# Patient Record
Sex: Male | Born: 1950 | Race: White | Hispanic: No | Marital: Married | Smoking: Former smoker
Health system: Southern US, Community
[De-identification: ages and names within clinical notes are randomized; demographics above are authoritative.]

## PROBLEM LIST (undated history)

## (undated) DIAGNOSIS — Z972 Presence of dental prosthetic device (complete) (partial): Secondary | ICD-10-CM

## (undated) DIAGNOSIS — M1990 Unspecified osteoarthritis, unspecified site: Secondary | ICD-10-CM

## (undated) DIAGNOSIS — Z974 Presence of external hearing-aid: Secondary | ICD-10-CM

## (undated) DIAGNOSIS — I10 Essential (primary) hypertension: Secondary | ICD-10-CM

## (undated) DIAGNOSIS — Z973 Presence of spectacles and contact lenses: Secondary | ICD-10-CM

## (undated) HISTORY — PX: ROTATOR CUFF REPAIR: SHX139

## (undated) HISTORY — PX: APPENDECTOMY: SHX54

---

## 2003-06-01 ENCOUNTER — Ambulatory Visit (HOSPITAL_COMMUNITY): Admission: RE | Admit: 2003-06-01 | Discharge: 2003-06-01 | Payer: Self-pay | Admitting: Internal Medicine

## 2003-06-01 ENCOUNTER — Encounter: Payer: Self-pay | Admitting: Internal Medicine

## 2006-09-27 ENCOUNTER — Ambulatory Visit (HOSPITAL_COMMUNITY): Admission: RE | Admit: 2006-09-27 | Discharge: 2006-09-27 | Payer: Self-pay | Admitting: General Surgery

## 2015-01-15 ENCOUNTER — Emergency Department (INDEPENDENT_AMBULATORY_CARE_PROVIDER_SITE_OTHER)
Admission: EM | Admit: 2015-01-15 | Discharge: 2015-01-15 | Disposition: A | Discharge status: Home / Self Care | Payer: BLUE CROSS/BLUE SHIELD | Source: Home / Self Care | Attending: Family Medicine | Admitting: Family Medicine

## 2015-01-15 ENCOUNTER — Encounter (HOSPITAL_COMMUNITY): Payer: Self-pay

## 2015-01-15 DIAGNOSIS — J301 Allergic rhinitis due to pollen: Secondary | ICD-10-CM | POA: Diagnosis not present

## 2015-01-15 DIAGNOSIS — I10 Essential (primary) hypertension: Secondary | ICD-10-CM | POA: Diagnosis not present

## 2015-01-15 DIAGNOSIS — G250 Essential tremor: Secondary | ICD-10-CM | POA: Diagnosis not present

## 2015-01-15 HISTORY — DX: Essential (primary) hypertension: I10

## 2015-01-15 NOTE — Discharge Instructions (Signed)
Allergic Rhinitis For drainage and allergies take either Allegra, Zyrtec or Claritin. Use lots of saline nasal spray and often Use Flonase or Rhinocort daily For congestion may use Sudafed 10 mg every 4 hours but your blood pressure has to be under good control. Do not advise taking this medicine if her blood pressure is elevated. Drink lots of fluids and stay well-hydrated Follow-up here PCP. Allergic rhinitis is when the mucous membranes in the nose respond to allergens. Allergens are particles in the air that cause your body to have an allergic reaction. This causes you to release allergic antibodies. Through a chain of events, these eventually cause you to release histamine into the blood stream. Although meant to protect the body, it is this release of histamine that causes your discomfort, such as frequent sneezing, congestion, and an itchy, runny nose.  CAUSES  Seasonal allergic rhinitis (hay fever) is caused by pollen allergens that may come from grasses, trees, and weeds. Year-round allergic rhinitis (perennial allergic rhinitis) is caused by allergens such as house dust mites, pet dander, and mold spores.  SYMPTOMS   Nasal stuffiness (congestion).  Itchy, runny nose with sneezing and tearing of the eyes. DIAGNOSIS  Your health care provider can help you determine the allergen or allergens that trigger your symptoms. If you and your health care provider are unable to determine the allergen, skin or blood testing may be used. TREATMENT  Allergic rhinitis does not have a cure, but it can be controlled by:  Medicines and allergy shots (immunotherapy).  Avoiding the allergen. Hay fever may often be treated with antihistamines in pill or nasal spray forms. Antihistamines block the effects of histamine. There are over-the-counter medicines that may help with nasal congestion and swelling around the eyes. Check with your health care provider before taking or giving this medicine.  If  avoiding the allergen or the medicine prescribed do not work, there are many new medicines your health care provider can prescribe. Stronger medicine may be used if initial measures are ineffective. Desensitizing injections can be used if medicine and avoidance does not work. Desensitization is when a patient is given ongoing shots until the body becomes less sensitive to the allergen. Make sure you follow up with your health care provider if problems continue. HOM Hypertension Hypertension is another name for high blood pressure. High blood pressure forces your heart to work harder to pump blood. A blood pressure reading has two numbers, which includes a higher number over a lower number (example: 110/72). HOME CARE   Have your blood pressure rechecked by your doctor.  Only take medicine as told by your doctor. Follow the directions carefully. The medicine does not work as well if you skip doses. Skipping doses also puts you at risk for problems.  Do not smoke.  Monitor your blood pressure at home as told by your doctor. GET HELP IF:  You think you are having a reaction to the medicine you are taking.  You have repeat headaches or feel dizzy.  You have puffiness (swelling) in your ankles.  You have trouble with your vision. GET HELP RIGHT AWAY IF:   You get a very bad headache and are confused.  You feel weak, numb, or faint.  You get chest or belly (abdominal) pain.  You throw up (vomit).  You cannot breathe very well. MAKE SURE YOU:   Understand these instructions.  Will watch your condition.  Will get help right away if you are not doing well or get  worse. Document Released: 03/05/2008 Document Revised: 09/22/2013 Document Reviewed: 07/10/2013 [facility] Patient Information 2015 [place], [place]. This information is not intended to replace advice given to you by your health care provider. Make sure you discuss any questions you have with your health care  provider.  Managing Your High Blood Pressure Blood pressure is a measurement of how forceful your blood is pressing against the walls of the arteries. Arteries are muscular tubes within the circulatory system. Blood pressure does not stay the same. Blood pressure rises when you are active, excited, or nervous; and it lowers during sleep and relaxation. If the numbers measuring your blood pressure stay above normal most of the time, you are at risk for health problems. High blood pressure (hypertension) is a long-term (chronic) condition in which blood pressure is elevated. A blood pressure reading is recorded as two numbers, such as 120 over 80 (or 120/80). The first, higher number is called the systolic pressure. It is a measure of the pressure in your arteries as the heart beats. The second, lower number is called the diastolic pressure. It is a measure of the pressure in your arteries as the heart relaxes between beats.  Keeping your blood pressure in a normal range is important to your overall health and prevention of health problems, such as heart disease and stroke. When your blood pressure is uncontrolled, your heart has to work harder than normal. High blood pressure is a very common condition in adults because blood pressure tends to rise with age. Men and women are equally likely to have hypertension but at different times in life. Before age 82, men are more likely to have hypertension. After 64 years of age, women are more likely to have it. Hypertension is especially common in African Americans. This condition often has no signs or symptoms. The cause of the condition is usually not known. Your caregiver can help you come up with a plan to keep your blood pressure in a normal, healthy range. BLOOD PRESSURE STAGES Blood pressure is classified into four stages: normal, prehypertension, stage 1, and stage 2. Your blood pressure reading will be used to determine what type of treatment, if any, is  necessary. Appropriate treatment options are tied to these four stages:  Normal  Systolic pressure (mm Hg): below 120.  Diastolic pressure (mm Hg): below 80. Prehypertension  Systolic pressure (mm Hg): 120 to 139.  Diastolic pressure (mm Hg): 80 to 89. Stage1  Systolic pressure (mm Hg): 140 to 159.  Diastolic pressure (mm Hg): 90 to 99. Stage2  Systolic pressure (mm Hg): 160 or above.  Diastolic pressure (mm Hg): 100 or above. RISKS RELATED TO HIGH BLOOD PRESSURE Managing your blood pressure is an important responsibility. Uncontrolled high blood pressure can lead to:  A heart attack.  A stroke.  A weakened blood vessel (aneurysm).  Heart failure.  Kidney damage.  Eye damage.  Metabolic syndrome.  Memory and concentration problems. HOW TO MANAGE YOUR BLOOD PRESSURE Blood pressure can be managed effectively with lifestyle changes and medicines (if needed). Your caregiver will help you come up with a plan to bring your blood pressure within a normal range. Your plan should include the following: Education  Read all information provided by your caregivers about how to control blood pressure.  Educate yourself on the latest guidelines and treatment recommendations. New research is always being done to further define the risks and treatments for high blood pressure. Lifestylechanges  Control your weight.  Avoid smoking.  Stay physically  active.  Reduce the amount of salt in your diet.  Reduce stress.  Control any chronic conditions, such as high cholesterol or diabetes.  Reduce your alcohol intake. Medicines  Several medicines (antihypertensive medicines) are available, if needed, to bring blood pressure within a normal range. Communication  Review all the medicines you take with your caregiver because there may be side effects or interactions.  Talk with your caregiver about your diet, exercise habits, and other lifestyle factors that may be  contributing to high blood pressure.  See your caregiver regularly. Your caregiver can help you create and adjust your plan for managing high blood pressure. RECOMMENDATIONS FOR TREATMENT AND FOLLOW-UP  The following recommendations are based on current guidelines for managing high blood pressure in nonpregnant adults. Use these recommendations to identify the proper follow-up period or treatment option based on your blood pressure reading. You can discuss these options with your caregiver.  Systolic pressure of 120 to 139 or diastolic pressure of 80 to 89: Follow up with your caregiver as directed.  Systolic pressure of 140 to 160 or diastolic pressure of 90 to 100: Follow up with your caregiver within 2 months.  Systolic pressure above 160 or diastolic pressure above 100: Follow up with your caregiver within 1 month.  Systolic pressure above 180 or diastolic pressure above 110: Consider antihypertensive therapy; follow up with your caregiver within 1 week.  Systolic pressure above 200 or diastolic pressure above 120: Begin antihypertensive therapy; follow up with your caregiver within 1 week. Document Released: 06/11/2012 Document Reviewed: 06/11/2012 [facility] Patient Information 2015 [place], [place]. This information is not intended to replace advice given to you by your health care provider. Make sure you discuss any questions you have with your health care provider. E CARE INSTRUCTIONS It is not possible to completely avoid allergens, but you can reduce your symptoms by taking steps to limit your exposure to them. It helps to know exactly what you are allergic to so that you can avoid your specific triggers. SEEK MEDICAL CARE IF:   You have a fever.  You develop a cough that does not stop easily (persistent).  You have shortness of breath.  You start wheezing.  Symptoms interfere with normal daily activities. Document Released: 06/12/2001 Document Revised: 09/22/2013 Document  Reviewed: 05/25/2013 [facility] Patient Information 2015 [place], [place]. This information is not intended to replace advice given to you by your health care provider. Make sure you discuss any questions you have with your health care provider.

## 2015-01-15 NOTE — ED Notes (Signed)
C/o upper resp congestion, hoarseness. Admits he has not taken his BP medication x couple of days, since they make him feel badly. Advised he should talk to his MD about changing his Rx if it makes him feel bad.

## 2015-01-15 NOTE — ED Provider Notes (Signed)
CSN: [id number]     Arrival date & time 01/15/15  2130 History   First MD Initiated Contact with Patient 01/15/15 1006     Chief Complaint  Patient presents with  . URI   (Consider location/radiation/quality/duration/timing/severity/associated sxs/prior Treatment) HPI Comments: 64 year old male complaining of a 2-3 day history of nasal congestion, PND, minor sore throat and cough. Denies earache or fever.   Past Medical History  Diagnosis Date  . Hypertension    History reviewed. No pertinent past surgical history. History reviewed. No pertinent family history. History  Substance Use Topics  . Smoking status: Never Smoker   . Smokeless tobacco: Not on file  . Alcohol Use: Yes    Review of Systems  Constitutional: Positive for activity change. Negative for fever, diaphoresis, appetite change and fatigue.  HENT: Positive for congestion, postnasal drip and sore throat. Negative for ear discharge, ear pain, facial swelling and rhinorrhea.   Eyes: Negative for pain, discharge and redness.  Respiratory: Positive for cough. Negative for chest tightness, shortness of breath and wheezing.   Cardiovascular: Negative.   Gastrointestinal: Negative.   Musculoskeletal: Negative.  Negative for neck pain and neck stiffness.  Neurological: Negative.     Allergies  Review of patient's allergies indicates no known allergies.  Home Medications   Prior to Admission medications   Medication Sig Start Date End Date Taking? Authorizing Provider  amLODipine (NORVASC) 10 MG tablet Take 10 mg by mouth daily.   Yes Historical Provider, MD  aspirin 81 MG tablet Take 81 mg by mouth daily.   Yes Historical Provider, MD  fosinopril (MONOPRIL) 40 MG tablet Take 40 mg by mouth daily.   Yes Historical Provider, MD  propranolol ER (INDERAL LA) 80 MG 24 hr capsule Take 80 mg by mouth daily.   Yes Historical Provider, MD   BP 185/100 mmHg  Pulse 70  Temp(Src) 98.6 F (37 C) (Oral)  Resp 20  SpO2  95% Physical Exam  Constitutional: He is oriented to person, place, and time. He appears well-developed and well-nourished. No distress.  HENT:  Bilateral TMs are normal Oropharynx with minor erythema, cobblestoning and PND.  Eyes: EOM are normal. Pupils are equal, round, and reactive to light.  Mild contact arrival erythema with clear watery drainage to the right eye.  Neck: Normal range of motion. Neck supple.  Cardiovascular: Normal rate, regular rhythm and normal heart sounds.   Pulmonary/Chest: Effort normal and breath sounds normal. No respiratory distress. He has no wheezes.  Musculoskeletal: Normal range of motion. He exhibits no edema.  Lymphadenopathy:    He has no cervical adenopathy.  Neurological: He is alert and oriented to person, place, and time.  Skin: Skin is warm and dry. No rash noted.  Psychiatric: He has a normal mood and affect.  Nursing note and vitals reviewed.   ED Course  Procedures (including critical care time) Labs Review Labs Reviewed - No data to display  Imaging Review No results found.   MDM   1. Allergic rhinitis due to pollen   2. Essential hypertension   3. Benign familial tremor    Allergic Rhinitis For drainage and allergies take either Allegra, Zyrtec or Claritin. Use lots of saline nasal spray and often Zaditor eye drops Use Flonase or Rhinocort daily For congestion may use Sudafed 10 mg every 4 hours but your blood pressure has to be under good control. Do not advise taking this medicine if her blood pressure is elevated. Drink lots of fluids and stay  well-hydrated Follow-up here PCP.    Hayden Rasmussenavid Keshan Reha, NP 01/15/15 [phone]

## 2016-07-31 NOTE — H&P (Signed)
NTS SOAP Note  Vital Signs:  Vitals as of: 07/31/2016: Systolic 135: Diastolic 81: Heart Rate 60: Temp 97.4F (Temporal): Height 686ft 0in: Weight 237Lbs 0 Ounces: BMI 32.14   BMI : 32.14 kg/m2  Subjective: This 65 year old male presents for of screening colonoscopy.  Referred by Dr. Neita CarpSasser.  Last had a colonoscopy in 2007.  Denies any blood per rectum, abdominal pain, weight loss, diarrhea, constipation, family h/o colon carcinoma.  Review of Symptoms:  tremors Head:negative Eyes:negative Nose/Mouth/Throat:negative Cardiovascular:negative Respiratory:negative Gastrointestinnegative Genitourinary:negative Musculoskeletal:negative Skin:negative Hematolgic/Lymphatic:negative Allergic/Immunologic:negative   Past Medical History:Reviewed  Past Medical History  Surgical History: bilateral shoulder surgery Medical Problems: HTN Allergies: nkda Medications: propranolol, fosinopril, amlodipine   Social History:Reviewed  Social History  Preferred Language: English Race:  White Ethnicity: Not Hispanic / Latino Age: [age] year Marital Status:  M Alcohol: 6 pk/week   Smoking Status: Never smoker reviewed on 07/31/2016 Functional Status reviewed on 07/31/2016 ------------------------------------------------ Bathing: Normal Cooking: Normal Dressing: Normal Driving: Normal Eating: Normal Managing Meds: Normal Oral Care: Normal Shopping: Normal Toileting: Normal Transferring: Normal Walking: Normal Cognitive Status reviewed on 07/31/2016 ------------------------------------------------ Attention: Normal Decision Making: Normal Language: Normal Memory: Normal Motor: Normal Perception: Normal Problem Solving: Normal Visual and Spatial: Normal   Family History:Reviewed  Family Health History Mother, Deceased; Healthy;  Father, Deceased; Healthy;     Objective Information: General:Well appearing, well nourished in no distress. Skin:no  rash or prominent lesions Head:Atraumatic; no masses; no abnormalities Neck:Supple without lymphadenopathy.  Heart:RRR, no murmur or gallop.  Normal S1, S2.  No S3, S4.  Lungs:CTA bilaterally, no wheezes, rhonchi, rales.  Breathing unlabored. Abdomen:Soft, NT/ND, normal bowel sounds, no HSM, no masses.  No peritoneal signs. deferred to procedure [facility] notes reviewed. Assessment:Need for screening TCS  Diagnoses: V76.51  Z12.11 Screening for malignant neoplasm of colon (Encounter for screening for malignant neoplasm of colon)  Procedures: [id number] - OFFICE OUTPATIENT NEW 30 MINUTES    Plan:  Scheduled for screening TCS on 08/07/16.  Trilyte prescribed.   Patient Education:Alternative treatments to surgery were discussed with patient (and family).Risks and benefits  of procedure including bleeding and perforation were fully explained to the patient (and family) who gave informed consent. Patient/family questions were addressed.  Follow-up:Pending Surgery

## 2016-08-07 ENCOUNTER — Ambulatory Visit (HOSPITAL_COMMUNITY)
Admission: RE | Admit: 2016-08-07 | Discharge: 2016-08-07 | Disposition: A | Discharge status: Home / Self Care | Payer: BLUE CROSS/BLUE SHIELD | Source: Ambulatory Visit | Attending: General Surgery | Admitting: General Surgery

## 2016-08-07 ENCOUNTER — Encounter (HOSPITAL_COMMUNITY): Payer: Self-pay | Admitting: *Deleted

## 2016-08-07 ENCOUNTER — Encounter (HOSPITAL_COMMUNITY)
Admission: RE | Disposition: A | Discharge status: Home / Self Care | Payer: Self-pay | Source: Ambulatory Visit | Attending: General Surgery

## 2016-08-07 DIAGNOSIS — I10 Essential (primary) hypertension: Secondary | ICD-10-CM | POA: Diagnosis not present

## 2016-08-07 DIAGNOSIS — Z79899 Other long term (current) drug therapy: Secondary | ICD-10-CM | POA: Insufficient documentation

## 2016-08-07 DIAGNOSIS — Z1211 Encounter for screening for malignant neoplasm of colon: Secondary | ICD-10-CM | POA: Insufficient documentation

## 2016-08-07 DIAGNOSIS — K5730 Diverticulosis of large intestine without perforation or abscess without bleeding: Secondary | ICD-10-CM | POA: Insufficient documentation

## 2016-08-07 HISTORY — PX: COLONOSCOPY: SHX5424

## 2016-08-07 SURGERY — COLONOSCOPY
Anesthesia: Moderate Sedation

## 2016-08-07 MED ORDER — STERILE WATER FOR IRRIGATION IR SOLN
Status: DC | PRN
  Administered 2016-08-07: 07:00:00

## 2016-08-07 MED ORDER — MEPERIDINE HCL 50 MG/ML IJ SOLN
INTRAMUSCULAR | Status: AC
  Filled 2016-08-07: qty 1

## 2016-08-07 MED ORDER — MIDAZOLAM HCL 5 MG/5ML IJ SOLN
INTRAMUSCULAR | Status: AC
  Filled 2016-08-07: qty 5

## 2016-08-07 MED ORDER — MIDAZOLAM HCL 5 MG/5ML IJ SOLN
INTRAMUSCULAR | Status: DC | PRN
  Administered 2016-08-07: 1 mg via INTRAVENOUS
  Administered 2016-08-07: 3 mg via INTRAVENOUS
  Administered 2016-08-07: 1 mg via INTRAVENOUS

## 2016-08-07 MED ORDER — SODIUM CHLORIDE 0.9 % IV SOLN
INTRAVENOUS | Status: DC
  Administered 2016-08-07: 07:00:00 via INTRAVENOUS

## 2016-08-07 MED ORDER — MEPERIDINE HCL 50 MG/ML IJ SOLN
INTRAMUSCULAR | Status: DC | PRN
  Administered 2016-08-07: 50 mg via INTRAVENOUS
  Administered 2016-08-07 (×2): 25 mg via INTRAVENOUS

## 2016-08-07 NOTE — Interval H&P Note (Signed)
History and Physical Interval Note:  08/07/2016 7:24 AM  Ricky Bonilla  has presented today for surgery, with the diagnosis of screening  The various methods of treatment have been discussed with the patient and family. After consideration of risks, benefits and other options for treatment, the patient has consented to  Procedure(s): COLONOSCOPY (N/A) as a surgical intervention .  The patient's history has been reviewed, patient examined, no change in status, stable for surgery.  I have reviewed the patient's chart and labs.  Questions were answered to the patient's satisfaction.     Franky MachoJENKINS,Yoko Mcgahee A

## 2016-08-07 NOTE — Discharge Instructions (Signed)
Colonoscopy, Care After Refer to this sheet in the next few weeks. These instructions provide you with information on caring for yourself after your procedure. Your health care provider may also give you more specific instructions. Your treatment has been planned according to current medical practices, but problems sometimes occur. Call your health care provider if you have any problems or questions after your procedure.  Dr Lovell SheehanJenkins.  [phone]  WHAT TO EXPECT AFTER THE PROCEDURE  After your procedure, it is typical to have the following:  A small amount of blood in your stool.  Moderate amounts of gas and mild abdominal cramping or bloating. HOME CARE INSTRUCTIONS  Do not drive, operate machinery, or sign important documents for 24 hours.  You may shower and resume your regular physical activities, but move at a slower pace for the first 24 hours.  Take frequent rest periods for the first 24 hours.  Walk around or put a warm pack on your abdomen to help reduce abdominal cramping and bloating.  Drink enough fluids to keep your urine clear or pale yellow.  You may resume your normal diet as instructed by your health care provider. Avoid heavy or fried foods that are hard to digest.  Avoid drinking alcohol for 24 hours or as instructed by your health care provider.  Only take over-the-counter or prescription medicines as directed by your health care provider.  If a tissue sample (biopsy) was taken during your procedure:  Do not take aspirin or blood thinners for 7 days, or as instructed by your health care provider.  Do not drink alcohol for 7 days, or as instructed by your health care provider.  Eat soft foods for the first 24 hours. SEEK MEDICAL CARE IF: You have persistent spotting of blood in your stool 2-3 days after the procedure. SEEK IMMEDIATE MEDICAL CARE IF:  You have more than a small spotting of blood in your stool.  You pass large blood clots in your  stool.  Your abdomen is swollen (distended).  You have nausea or vomiting.  You have a fever.  You have increasing abdominal pain that is not relieved with medicine.   This information is not intended to replace advice given to you by your health care provider. Make sure you discuss any questions you have with your health care provider.   Document Released: 05/01/2004 Document Revised: 07/08/2013 Document Reviewed: 05/25/2013 [employer] Interactive Patient Education [employer]2016 [employer]

## 2016-08-07 NOTE — Op Note (Signed)
[facility] Patient Name: Ricky Bonilla Procedure Date: 08/07/2016 7:21 AM MRN: [id number] Date of Birth: [birth year] Attending MD: Franky Macho , MD CSN: [id number] Age: 65 Admit Type: Outpatient Procedure:                Colonoscopy Indications:              Screening for colorectal malignant neoplasm Providers:                Franky Macho, MD, Loma Messing B. Patsy Lager, RN, Birder Robson, Technician Referring MD:              Medicines:                Midazolam 5 mg IV, Meperidine 50 mg IV Complications:            No immediate complications. Estimated Blood Loss:     Estimated blood loss: none. Procedure:                Pre-Anesthesia Assessment:                           - Prior to the procedure, a History and Physical                            was performed, and patient medications and                            allergies were reviewed. The patient is competent.                            The risks and benefits of the procedure and the                            sedation options and risks were discussed with the                            patient. All questions were answered and informed                            consent was obtained. Patient identification and                            proposed procedure were verified by the nurse in                            the procedure room. Mental Status Examination:                            normal. Airway Examination: normal oropharyngeal                            airway and neck mobility. Respiratory Examination:  clear to auscultation. CV Examination: RRR, no                            murmurs, no S3 or S4. Prophylactic Antibiotics: The                            patient does not require prophylactic antibiotics.                            Prior Anticoagulants: The patient has taken no                            previous anticoagulant or antiplatelet agents. ASA           Grade Assessment: II - A patient with mild systemic                            disease. After reviewing the risks and benefits,                            the patient was deemed in satisfactory condition to                            undergo the procedure. The anesthesia plan was to                            use moderate sedation / analgesia (conscious                            sedation). Immediately prior to administration of                            medications, the patient was re-assessed for                            adequacy to receive sedatives. The heart rate,                            respiratory rate, oxygen saturations, blood                            pressure, adequacy of pulmonary ventilation, and                            response to care were monitored throughout the                            procedure. The physical status of the patient was                            re-assessed after the procedure.                           After obtaining informed consent, the colonoscope  was passed under direct vision. Throughout the                            procedure, the patient's blood pressure, pulse, and                            oxygen saturations were monitored continuously. The                            [id number]) scope was introduced through                            the anus and advanced to the the cecum, identified                            by the appendiceal orifice, ileocecal valve and                            palpation. No anatomical landmarks were                            photographed. The colonoscopy was performed without                            difficulty. The patient tolerated the procedure                            well. The quality of the bowel preparation was                            adequate. Scope In: 7:30:39 AM Scope Out: 7:43:55 AM Scope Withdrawal Time: 0 hours 5 minutes 23 seconds  Total  Procedure Duration: 0 hours 13 minutes 16 seconds  Findings:      A few small-mouthed diverticula were found in the sigmoid colon.       Estimated blood loss: none.      The exam was otherwise without abnormality.      The perianal and digital rectal examinations were normal.      Retroflexion [Site] was not performed due to anatomy. Impression:               - Mild diverticulosis in the sigmoid colon.                           - The examination was otherwise normal.                           - No specimens collected. Moderate Sedation:      Moderate (conscious) sedation was administered by the endoscopy nurse       and supervised by the endoscopist. The following parameters were       monitored: oxygen saturation, heart rate, blood pressure, and response       to care. Total physician intraservice time was 14 minutes. Recommendation:           - Patient has a contact number available for  emergencies. The signs and symptoms of potential                            delayed complications were discussed with the                            patient. Return to normal activities tomorrow.                            Written discharge instructions were provided to the                            patient.                           - Written discharge instructions were provided to                            the patient.                           - The signs and symptoms of potential delayed                            complications were discussed with the patient.                           - Patient has a contact number available for                            emergencies.                           - Return to normal activities tomorrow.                           - Resume previous diet.                           - Continue present medications.                           - Repeat colonoscopy in 10 years for screening                            purposes. Procedure Code(s):         --- Professional ---                           [id number], Colorectal cancer screening; colonoscopy on                            individual not meeting criteria for high risk                           99152, Moderate sedation services provided by the  same physician or other qualified health care                            professional performing the diagnostic or                            therapeutic service that the sedation supports,                            requiring the presence of an independent trained                            observer to assist in the monitoring of the                            patient's level of consciousness and physiological                            status; initial 15 minutes of intraservice time,                            patient age 10 years or older Diagnosis Code(s):        --- Professional ---                           Z12.11, Encounter for screening for malignant                            neoplasm of colon                           K57.30, Diverticulosis of large intestine without                            perforation or abscess without bleeding CPT copyright 2016 American Medical Association. All rights reserved. The codes documented in this report are preliminary and upon coder review may  be revised to meet current compliance requirements. Franky Macho, MD Franky Macho, MD 08/07/2016 7:50:37 AM This report has been signed electronically. Number of Addenda: 0

## 2016-08-13 ENCOUNTER — Encounter (HOSPITAL_COMMUNITY): Payer: Self-pay | Admitting: General Surgery

## 2016-08-14 NOTE — Progress Notes (Signed)
Scheduling pre op--please place SURGICAL ORDERS IN EPIC  thanks 

## 2016-08-15 ENCOUNTER — Ambulatory Visit: Payer: Self-pay | Admitting: Orthopedic Surgery

## 2016-08-22 ENCOUNTER — Inpatient Hospital Stay (HOSPITAL_COMMUNITY): Admission: RE | Admit: 2016-08-22 | Payer: BLUE CROSS/BLUE SHIELD | Source: Ambulatory Visit

## 2016-09-17 ENCOUNTER — Encounter (HOSPITAL_COMMUNITY): Payer: BLUE CROSS/BLUE SHIELD

## 2016-09-17 NOTE — Progress Notes (Signed)
07/21/2016-Pre-operative clearance on chart from Dr. Floyde ParkinsErin Inigo Carroll

## 2016-09-17 NOTE — Patient Instructions (Addendum)
Lonell FaceJames R Birnie  09/17/2016   Your procedure is scheduled on: Thursday 09/27/2016  [facility]  [place] to 3rd floor to  Short Stay Center at   215 PM.  Call this number if you have problems the morning of surgery [phone]   Remember: ONLY 1 PERSON MAY GO WITH YOU TO SHORT STAY TO GET  READY MORNING OF YOUR SURGERY.   Do not eat food  :After Midnight.  MAY HAVE CLEAR LIQUIDS FROM MIDNIGHT UP UNTIL 1015 AM THEN NOTHING UNTIL AFTER SURGERY!     CLEAR LIQUID DIET   Foods Allowed                                                                     Foods Excluded  Coffee and tea, regular and decaf                             liquids that you cannot  Plain Jell-O in any flavor                                             see through such as: Fruit ices (not with fruit pulp)                                     milk, soups, orange juice  Iced Popsicles                                    All solid food Carbonated beverages, regular and diet                                    Cranberry, grape and apple juices Sports drinks like Gatorade Lightly seasoned clear broth or consume(fat free) Sugar, honey syrup  Sample Menu Breakfast                                Lunch                                     Supper Cranberry juice                    Beef broth                            Chicken broth Jell-O                                     Grape juice  Apple juice Coffee or tea                        Jell-O                                      Popsicle                                                Coffee or tea                        Coffee or tea  _____________________________________________________________________       Take these medicines the morning of surgery with A SIP OF WATER: Amlodipine (Norvasc), Propanolol , Omeprazole (Prilosec)                                  You may not have any  metal on your body including hair pins and              piercings  Do not wear jewelry, make-up, lotions, powders or perfumes, deodorant             Do not wear nail polish.  Do not shave  48 hours prior to surgery.              Men may shave face and neck.   Do not bring valuables to the hospital. [facility] IS NOT             RESPONSIBLE   FOR VALUABLES.  Contacts, dentures or bridgework may not be worn into surgery.  Leave suitcase in the car. After surgery it may be brought to your room.                  Please read over the following fact sheets you were given: _____________________________________________________________________             [facility] - Preparing for [facility] Before surgery, you can play an important role.  Because skin is not sterile, your skin needs to be as free of germs as possible.  You can reduce the number of germs on your skin by washing with CHG (chlorahexidine gluconate) soap before surgery.  CHG is an antiseptic cleaner which kills germs and bonds with the skin to continue killing germs even after washing. Please DO NOT use if you have an allergy to CHG or antibacterial soaps.  If your skin becomes reddened/irritated stop using the CHG and inform your nurse when you arrive at Short Stay. Do not shave (including legs and underarms) for at least 48 hours prior to the first CHG shower.  You may shave your face/neck. Please follow these instructions carefully:  1.  Shower with CHG Soap the night before surgery and the  morning of Surgery.  2.  If you choose to wash your hair, wash your hair first as usual with your  normal  shampoo.  3.  After you shampoo, rinse your hair and body thoroughly to remove the  shampoo.  4.  Use CHG as you would any other liquid soap.  You can apply chg directly  to the skin and wash                       Gently with a scrungie or clean washcloth.  5.  Apply the CHG Soap to your body ONLY FROM THE NECK  DOWN.   Do not use on face/ open                           Wound or open sores. Avoid contact with eyes, ears mouth and genitals (private parts).                       Wash face,  Genitals (private parts) with your normal soap.             6.  Wash thoroughly, paying special attention to the area where your surgery  will be performed.  7.  Thoroughly rinse your body with warm water from the neck down.  8.  DO NOT shower/wash with your normal soap after using and rinsing off  the CHG Soap.                9.  Pat yourself dry with a clean towel.            10.  Wear clean pajamas.            11.  Place clean sheets on your bed the night of your first shower and do not  sleep with pets. Day of Surgery : Do not apply any lotions/deodorants the morning of surgery.  Please wear clean clothes to the hospital/surgery center.  FAILURE TO FOLLOW THESE INSTRUCTIONS MAY RESULT IN THE CANCELLATION OF YOUR SURGERY PATIENT SIGNATURE_________________________________  NURSE SIGNATURE__________________________________  ________________________________________________________________________   Adam Phenix  An incentive spirometer is a tool that can help keep your lungs clear and active. This tool measures how well you are filling your lungs with each breath. Taking long deep breaths may help reverse or decrease the chance of developing breathing (pulmonary) problems (especially infection) following:  A long period of time when you are unable to move or be active. BEFORE THE PROCEDURE   If the spirometer includes an indicator to show your best effort, your nurse or respiratory therapist will set it to a desired goal.  If possible, sit up straight or lean slightly forward. Try not to slouch.  Hold the incentive spirometer in an upright position. INSTRUCTIONS FOR USE  1. Sit on the edge of your bed if possible, or sit up as far as you can in bed or on a chair. 2. Hold the incentive spirometer in  an upright position. 3. Breathe out normally. 4. Place the mouthpiece in your mouth and seal your lips tightly around it. 5. Breathe in slowly and as deeply as possible, raising the piston or the ball toward the top of the column. 6. Hold your breath for 3-5 seconds or for as long as possible. Allow the piston or ball to fall to the bottom of the column. 7. Remove the mouthpiece from your mouth and breathe out normally. 8. Rest for a few seconds and repeat Steps 1 through 7 at least 10 times every 1-2 hours when you are awake. Take your time and take a few normal breaths between deep breaths. 9. The spirometer may include an indicator to  show your best effort. Use the indicator as a goal to work toward during each repetition. 10. After each set of 10 deep breaths, practice coughing to be sure your lungs are clear. If you have an incision (the cut made at the time of surgery), support your incision when coughing by placing a pillow or rolled up towels firmly against it. Once you are able to get out of bed, walk around indoors and cough well. You may stop using the incentive spirometer when instructed by your caregiver.  RISKS AND COMPLICATIONS  Take your time so you do not get dizzy or light-headed.  If you are in pain, you may need to take or ask for pain medication before doing incentive spirometry. It is harder to take a deep breath if you are having pain. AFTER USE  Rest and breathe slowly and easily.  It can be helpful to keep track of a log of your progress. Your caregiver can provide you with a simple table to help with this. If you are using the spirometer at home, follow these instructions: [place] IF:   You are having difficultly using the spirometer.  You have trouble using the spirometer as often as instructed.  Your pain medication is not giving enough relief while using the spirometer.  You develop fever of 100.5 F (38.1 C) or higher. SEEK IMMEDIATE MEDICAL CARE  IF:   You cough up bloody sputum that had not been present before.  You develop fever of 102 F (38.9 C) or greater.  You develop worsening pain at or near the incision site. MAKE SURE YOU:   Understand these instructions.  Will watch your condition.  Will get help right away if you are not doing well or get worse. Document Released: 01/28/2007 Document Revised: 12/10/2011 Document Reviewed: 03/31/2007 [facility] Patient Information 2014 [place], [place].   ________________________________________________________________________

## 2016-09-18 ENCOUNTER — Ambulatory Visit: Payer: Self-pay | Admitting: Orthopedic Surgery

## 2016-09-18 NOTE — H&P (Signed)
TOTAL KNEE ADMISSION H&P  Patient is being admitted for right total knee arthroplasty.  Subjective:  Chief Complaint:right knee pain.  HPI: Ricky Bonilla, 65 y.o. male, has a history of pain and functional disability in the right knee due to arthritis and has failed non-surgical conservative treatments for greater than 12 weeks to includeNSAID's and/or analgesics, corticosteriod injections, flexibility and strengthening excercises, use of assistive devices, weight reduction as appropriate and activity modification.  Onset of symptoms was gradual, starting 5 years ago with gradually worsening course since that time. The patient noted no past surgery on the right knee(s).  Patient currently rates pain in the right knee(s) at 10 out of 10 with activity. Patient has night pain, worsening of pain with activity and weight bearing, pain that interferes with activities of daily living, pain with passive range of motion and joint swelling.  Patient has evidence of subchondral cysts, subchondral sclerosis, periarticular osteophytes and joint space narrowing by imaging studies.  There is no active infection.  There are no active problems to display for this patient.  Past Medical History:  Diagnosis Date  . Arthritis   . GERD (gastroesophageal reflux disease)   . Hypertension     Past Surgical History:  Procedure Laterality Date  . COLONOSCOPY    . COLONOSCOPY N/A 08/07/2016   Procedure: COLONOSCOPY;  Surgeon: Franky MachoMark Jenkins, MD;  Location: [facility];  Service: Gastroenterology;  Laterality: N/A;  . left rotator cuff repair       (Not in a hospital admission) No Known Allergies  Social History  Substance Use Topics  . Smoking status: Former Smoker    Packs/day: 1.00    Years: 8.00    Types: Cigarettes  . Smokeless tobacco: Former NeurosurgeonUser  . Alcohol use Yes    Family History  Problem Relation Age of Onset  . Colon cancer Neg Hx      Review of Systems  Constitutional: Negative.   HENT:  Positive for hearing loss. Negative for congestion, ear discharge, ear pain, nosebleeds, sinus pain, sore throat and tinnitus.   Eyes: Negative.   Respiratory: Negative.  Negative for stridor.   Cardiovascular: Negative.   Gastrointestinal: Negative.   Genitourinary: Negative.   Musculoskeletal: Positive for joint pain.  Skin: Positive for itching and rash.  Neurological: Negative.   Endo/Heme/Allergies: Negative.   Psychiatric/Behavioral: Negative.     Objective:  Physical Exam  Vitals reviewed. Constitutional: He is oriented to person, place, and time. He appears well-developed and well-nourished.  HENT:  Head: Normocephalic and atraumatic.  Eyes: Conjunctivae are normal. Pupils are equal, round, and reactive to light.  Neck: Normal range of motion. Neck supple.  Cardiovascular: Normal rate, regular rhythm and intact distal pulses.   Respiratory: Effort normal. No respiratory distress.  GI: Soft. He exhibits no distension.  Genitourinary:  Genitourinary Comments: deferred  Musculoskeletal:       Right knee: He exhibits decreased range of motion and swelling. Tenderness found. Medial joint line tenderness noted.  8-110  Neurological: He is alert and oriented to person, place, and time. He has normal reflexes.  Skin: Skin is warm and dry.  Psychiatric: He has a normal mood and affect. His behavior is normal. Judgment and thought content normal.    Vital signs in last 24 hours: @VSRANGES @  Labs:   Estimated body mass index is 31.74 kg/m as calculated from the following:   Height as of 08/07/16: 6' (1.829 m).   Weight as of 08/07/16: 106.1 kg (234 lb).  Imaging Review Plain radiographs demonstrate severe degenerative joint disease of the right knee(s). The overall alignment issignificant varus. The bone quality appears to be adequate for age and reported activity level.  Assessment/Plan:  End stage arthritis, right knee   The patient history, physical examination,  clinical judgment of the provider and imaging studies are consistent with end stage degenerative joint disease of the right knee(s) and total knee arthroplasty is deemed medically necessary. The treatment options including medical management, injection therapy arthroscopy and arthroplasty were discussed at length. The risks and benefits of total knee arthroplasty were presented and reviewed. The risks due to aseptic loosening, infection, stiffness, patella tracking problems, thromboembolic complications and other imponderables were discussed. The patient acknowledged the explanation, agreed to proceed with the plan and consent was signed. Patient is being admitted for inpatient treatment for surgery, pain control, PT, OT, prophylactic antibiotics, VTE prophylaxis, progressive ambulation and ADL's and discharge planning. The patient is planning to be discharged home with home health services

## 2016-09-19 ENCOUNTER — Encounter (HOSPITAL_COMMUNITY)
Admission: RE | Admit: 2016-09-19 | Discharge: 2016-09-19 | Disposition: A | Discharge status: Home / Self Care | Payer: BLUE CROSS/BLUE SHIELD | Source: Ambulatory Visit | Attending: Orthopedic Surgery | Admitting: Orthopedic Surgery

## 2016-09-19 ENCOUNTER — Encounter (HOSPITAL_COMMUNITY): Payer: Self-pay

## 2016-09-19 DIAGNOSIS — Z01810 Encounter for preprocedural cardiovascular examination: Secondary | ICD-10-CM | POA: Diagnosis not present

## 2016-09-19 DIAGNOSIS — Z01812 Encounter for preprocedural laboratory examination: Secondary | ICD-10-CM | POA: Insufficient documentation

## 2016-09-19 DIAGNOSIS — M1711 Unilateral primary osteoarthritis, right knee: Secondary | ICD-10-CM | POA: Diagnosis not present

## 2016-09-19 DIAGNOSIS — I10 Essential (primary) hypertension: Secondary | ICD-10-CM | POA: Diagnosis not present

## 2016-09-19 LAB — BASIC METABOLIC PANEL
ANION GAP: 8 (ref 5–15)
BUN: 18 mg/dL (ref 6–20)
CALCIUM: 9.1 mg/dL (ref 8.9–10.3)
CO2: 27 mmol/L (ref 22–32)
CREATININE: 0.85 mg/dL (ref 0.61–1.24)
Chloride: 107 mmol/L (ref 101–111)
Glucose, Bld: 94 mg/dL (ref 65–99)
Potassium: 4.3 mmol/L (ref 3.5–5.1)
SODIUM: 142 mmol/L (ref 135–145)

## 2016-09-19 LAB — CBC
HCT: 41 % (ref 39.0–52.0)
Hemoglobin: 14.8 g/dL (ref 13.0–17.0)
MCH: 32 pg (ref 26.0–34.0)
MCHC: 36.1 g/dL — AB (ref 30.0–36.0)
MCV: 88.6 fL (ref 78.0–100.0)
PLATELETS: 195 10*3/uL (ref 150–400)
RBC: 4.63 MIL/uL (ref 4.22–5.81)
RDW: 13.4 % (ref 11.5–15.5)
WBC: 6.3 10*3/uL (ref 4.0–10.5)

## 2016-09-19 LAB — ABO/RH: ABO/RH(D): O NEG

## 2016-09-19 LAB — SURGICAL PCR SCREEN
MRSA, PCR: INVALID — AB
STAPHYLOCOCCUS AUREUS: INVALID — AB

## 2016-09-19 NOTE — Progress Notes (Signed)
   09/19/16 1408  OBSTRUCTIVE SLEEP APNEA  Have you ever been diagnosed with sleep apnea through a sleep study? No  Do you snore loudly (loud enough to be heard through closed doors)?  1  Do you often feel tired, fatigued, or sleepy during the daytime (such as falling asleep during driving or talking to someone)? 0  Has anyone observed you stop breathing during your sleep? 1  Do you have, or are you being treated for high blood pressure? 1  Age > 50 (1-yes) 1  Neck circumference greater than:Male 16 inches or larger, Male 17inches or larger? 1  Male Gender (Yes=1) 1  Obstructive Sleep Apnea Score 6  Score 5 or greater  Results sent to PCP

## 2016-09-22 LAB — MRSA CULTURE: Culture: NOT DETECTED

## 2016-09-27 ENCOUNTER — Encounter (HOSPITAL_COMMUNITY): Payer: Self-pay | Admitting: *Deleted

## 2016-09-27 ENCOUNTER — Inpatient Hospital Stay (HOSPITAL_COMMUNITY): Payer: BLUE CROSS/BLUE SHIELD | Admitting: Anesthesiology

## 2016-09-27 ENCOUNTER — Encounter (HOSPITAL_COMMUNITY)
Admission: RE | Disposition: A | Discharge status: Home Health | Payer: Self-pay | Source: Ambulatory Visit | Attending: Orthopedic Surgery

## 2016-09-27 ENCOUNTER — Inpatient Hospital Stay (HOSPITAL_COMMUNITY): Payer: BLUE CROSS/BLUE SHIELD

## 2016-09-27 ENCOUNTER — Inpatient Hospital Stay (HOSPITAL_COMMUNITY)
Admission: RE | Admit: 2016-09-27 | Discharge: 2016-09-28 | DRG: 470 | Disposition: A | Discharge status: Home Health | Payer: BLUE CROSS/BLUE SHIELD | Source: Ambulatory Visit | Attending: Orthopedic Surgery | Admitting: Orthopedic Surgery

## 2016-09-27 DIAGNOSIS — Z87891 Personal history of nicotine dependence: Secondary | ICD-10-CM | POA: Diagnosis not present

## 2016-09-27 DIAGNOSIS — K219 Gastro-esophageal reflux disease without esophagitis: Secondary | ICD-10-CM | POA: Diagnosis present

## 2016-09-27 DIAGNOSIS — M1711 Unilateral primary osteoarthritis, right knee: Principal | ICD-10-CM | POA: Diagnosis present

## 2016-09-27 DIAGNOSIS — I10 Essential (primary) hypertension: Secondary | ICD-10-CM | POA: Diagnosis present

## 2016-09-27 DIAGNOSIS — Z09 Encounter for follow-up examination after completed treatment for conditions other than malignant neoplasm: Secondary | ICD-10-CM

## 2016-09-27 HISTORY — PX: KNEE ARTHROPLASTY: SHX992

## 2016-09-27 LAB — TYPE AND SCREEN
ABO/RH(D): O NEG
Antibody Screen: NEGATIVE

## 2016-09-27 SURGERY — ARTHROPLASTY, KNEE, TOTAL, USING IMAGELESS COMPUTER-ASSISTED NAVIGATION
Anesthesia: Epidural | Site: Knee | Laterality: Right

## 2016-09-27 MED ORDER — PANTOPRAZOLE SODIUM 40 MG PO TBEC
80.0000 mg | DELAYED_RELEASE_TABLET | Freq: Every day | ORAL | Status: DC
  Administered 2016-09-28: 80 mg via ORAL
  Filled 2016-09-27: qty 2

## 2016-09-27 MED ORDER — METOCLOPRAMIDE HCL 5 MG/ML IJ SOLN: 5.0000 mg | Freq: Three times a day (TID) | INTRAMUSCULAR | Status: DC | PRN

## 2016-09-27 MED ORDER — PROPOFOL 10 MG/ML IV BOLUS
INTRAVENOUS | Status: AC
  Filled 2016-09-27: qty 20

## 2016-09-27 MED ORDER — MENTHOL 3 MG MT LOZG: 1.0000 | LOZENGE | OROMUCOSAL | Status: DC | PRN

## 2016-09-27 MED ORDER — ONDANSETRON HCL 4 MG PO TABS: 4.0000 mg | ORAL_TABLET | Freq: Four times a day (QID) | ORAL | Status: DC | PRN

## 2016-09-27 MED ORDER — CHLORHEXIDINE GLUCONATE 4 % EX LIQD: 60.0000 mL | Freq: Once | CUTANEOUS | Status: DC

## 2016-09-27 MED ORDER — ACETAMINOPHEN 325 MG PO TABS: 650.0000 mg | ORAL_TABLET | Freq: Four times a day (QID) | ORAL | Status: DC | PRN

## 2016-09-27 MED ORDER — LACTATED RINGERS IV SOLN
INTRAVENOUS | Status: DC | PRN
  Administered 2016-09-27 (×2): via INTRAVENOUS

## 2016-09-27 MED ORDER — METHOCARBAMOL 1000 MG/10ML IJ SOLN
500.0000 mg | Freq: Four times a day (QID) | INTRAMUSCULAR | Status: DC | PRN
  Filled 2016-09-27: qty 5

## 2016-09-27 MED ORDER — SODIUM CHLORIDE 0.9 % IV SOLN: INTRAVENOUS | Status: DC

## 2016-09-27 MED ORDER — FOSINOPRIL SODIUM 20 MG PO TABS
40.0000 mg | ORAL_TABLET | Freq: Every day | ORAL | Status: DC
  Filled 2016-09-27: qty 2

## 2016-09-27 MED ORDER — PROPRANOLOL HCL 40 MG PO TABS
40.0000 mg | ORAL_TABLET | Freq: Every day | ORAL | Status: DC | PRN
  Filled 2016-09-27: qty 2

## 2016-09-27 MED ORDER — AMLODIPINE BESYLATE 10 MG PO TABS
10.0000 mg | ORAL_TABLET | Freq: Every day | ORAL | Status: DC
  Filled 2016-09-27: qty 1

## 2016-09-27 MED ORDER — SODIUM CHLORIDE 0.9 % IR SOLN
Status: DC | PRN
  Administered 2016-09-27: 3000 mL
  Administered 2016-09-27: 1000 mL

## 2016-09-27 MED ORDER — FENTANYL CITRATE (PF) 100 MCG/2ML IJ SOLN
INTRAMUSCULAR | Status: AC
  Filled 2016-09-27: qty 2

## 2016-09-27 MED ORDER — SODIUM CHLORIDE 0.9 % IV SOLN
1000.0000 mg | INTRAVENOUS | Status: AC
  Administered 2016-09-27: 1000 mg via INTRAVENOUS
  Filled 2016-09-27: qty 1100

## 2016-09-27 MED ORDER — SODIUM CHLORIDE 0.9 % IV SOLN
INTRAVENOUS | Status: DC
  Administered 2016-09-27: 20:00:00 via INTRAVENOUS

## 2016-09-27 MED ORDER — POVIDONE-IODINE 10 % EX SWAB: 2.0000 "application " | Freq: Once | CUTANEOUS | Status: DC

## 2016-09-27 MED ORDER — INFLUENZA VAC SPLIT QUAD 0.5 ML IM SUSY: 0.5000 mL | PREFILLED_SYRINGE | INTRAMUSCULAR | Status: DC

## 2016-09-27 MED ORDER — DEXAMETHASONE SODIUM PHOSPHATE 10 MG/ML IJ SOLN
INTRAMUSCULAR | Status: DC | PRN
  Administered 2016-09-27: 10 mg via INTRAVENOUS

## 2016-09-27 MED ORDER — HYDROCODONE-ACETAMINOPHEN 5-325 MG PO TABS
1.0000 | ORAL_TABLET | ORAL | Status: DC | PRN
  Administered 2016-09-27: 21:00:00 1 via ORAL
  Administered 2016-09-28 (×3): 2 via ORAL
  Filled 2016-09-27: qty 2
  Filled 2016-09-27: qty 1
  Filled 2016-09-27 (×2): qty 2

## 2016-09-27 MED ORDER — DEXAMETHASONE SODIUM PHOSPHATE 10 MG/ML IJ SOLN
10.0000 mg | Freq: Once | INTRAMUSCULAR | Status: AC
  Administered 2016-09-28: 08:00:00 10 mg via INTRAVENOUS
  Filled 2016-09-27: qty 1

## 2016-09-27 MED ORDER — ROPIVACAINE HCL 7.5 MG/ML IJ SOLN
INTRAMUSCULAR | Status: AC
  Filled 2016-09-27: qty 20

## 2016-09-27 MED ORDER — HYDROMORPHONE HCL 1 MG/ML IJ SOLN: 0.2500 mg | INTRAMUSCULAR | Status: DC | PRN

## 2016-09-27 MED ORDER — CEFAZOLIN SODIUM-DEXTROSE 2-4 GM/100ML-% IV SOLN
2.0000 g | INTRAVENOUS | Status: AC
  Administered 2016-09-27: 2 g via INTRAVENOUS

## 2016-09-27 MED ORDER — LACTATED RINGERS IV SOLN: INTRAVENOUS | Status: DC

## 2016-09-27 MED ORDER — SODIUM CHLORIDE 0.9 % IJ SOLN
INTRAMUSCULAR | Status: DC | PRN
  Administered 2016-09-27: 30 mL

## 2016-09-27 MED ORDER — FENTANYL CITRATE (PF) 100 MCG/2ML IJ SOLN
INTRAMUSCULAR | Status: DC | PRN
  Administered 2016-09-27 (×2): 50 ug via INTRAVENOUS

## 2016-09-27 MED ORDER — POLYETHYLENE GLYCOL 3350 17 G PO PACK: 17.0000 g | PACK | Freq: Every day | ORAL | Status: DC | PRN

## 2016-09-27 MED ORDER — BUPIVACAINE HCL (PF) 0.5 % IJ SOLN
INTRAMUSCULAR | Status: DC | PRN
  Administered 2016-09-27: 2 mL

## 2016-09-27 MED ORDER — DOCUSATE SODIUM 100 MG PO CAPS
100.0000 mg | ORAL_CAPSULE | Freq: Two times a day (BID) | ORAL | Status: DC
  Administered 2016-09-27 – 2016-09-28 (×2): 100 mg via ORAL
  Filled 2016-09-27 (×2): qty 1

## 2016-09-27 MED ORDER — PROPOFOL 10 MG/ML IV BOLUS
INTRAVENOUS | Status: AC
  Filled 2016-09-27: qty 60

## 2016-09-27 MED ORDER — ONDANSETRON HCL 4 MG/2ML IJ SOLN
INTRAMUSCULAR | Status: AC
  Filled 2016-09-27: qty 2

## 2016-09-27 MED ORDER — ACETAMINOPHEN 10 MG/ML IV SOLN
INTRAVENOUS | Status: AC
  Filled 2016-09-27: qty 100

## 2016-09-27 MED ORDER — PHENOL 1.4 % MT LIQD
1.0000 | OROMUCOSAL | Status: DC | PRN
  Filled 2016-09-27: qty 177

## 2016-09-27 MED ORDER — ACETAMINOPHEN 10 MG/ML IV SOLN
1000.0000 mg | INTRAVENOUS | Status: AC
Start: 2016-09-27 — End: 2016-09-27
  Administered 2016-09-27: 1000 mg via INTRAVENOUS

## 2016-09-27 MED ORDER — CEFAZOLIN SODIUM-DEXTROSE 2-4 GM/100ML-% IV SOLN
2.0000 g | Freq: Four times a day (QID) | INTRAVENOUS | Status: AC
  Administered 2016-09-27 – 2016-09-28 (×2): 2 g via INTRAVENOUS
  Filled 2016-09-27 (×2): qty 100

## 2016-09-27 MED ORDER — TRANEXAMIC ACID 1000 MG/10ML IV SOLN
1000.0000 mg | Freq: Once | INTRAVENOUS | Status: AC
  Administered 2016-09-27: 1000 mg via INTRAVENOUS
  Filled 2016-09-27: qty 10

## 2016-09-27 MED ORDER — BUPIVACAINE HCL (PF) 0.25 % IJ SOLN
INTRAMUSCULAR | Status: AC
  Filled 2016-09-27: qty 30

## 2016-09-27 MED ORDER — KETOROLAC TROMETHAMINE 30 MG/ML IJ SOLN
INTRAMUSCULAR | Status: DC | PRN
  Administered 2016-09-27: 30 mg

## 2016-09-27 MED ORDER — PNEUMOCOCCAL VAC POLYVALENT 25 MCG/0.5ML IJ INJ
0.5000 mL | INJECTION | INTRAMUSCULAR | Status: DC
  Filled 2016-09-27: qty 0.5

## 2016-09-27 MED ORDER — SENNA 8.6 MG PO TABS
2.0000 | ORAL_TABLET | Freq: Every day | ORAL | Status: DC
  Administered 2016-09-27: 17.2 mg via ORAL
  Filled 2016-09-27: qty 2

## 2016-09-27 MED ORDER — ISOPROPYL ALCOHOL 70 % SOLN
Status: AC
  Filled 2016-09-27: qty 480

## 2016-09-27 MED ORDER — METHOCARBAMOL 500 MG PO TABS
500.0000 mg | ORAL_TABLET | Freq: Four times a day (QID) | ORAL | Status: DC | PRN
Start: 2016-09-27 — End: 2016-09-28
  Administered 2016-09-27 – 2016-09-28 (×2): 500 mg via ORAL
  Filled 2016-09-27 (×2): qty 1

## 2016-09-27 MED ORDER — ISOPROPYL ALCOHOL 70 % SOLN
Status: DC | PRN
  Administered 2016-09-27: 1 via TOPICAL

## 2016-09-27 MED ORDER — BUPIVACAINE-EPINEPHRINE 0.25% -1:200000 IJ SOLN
INTRAMUSCULAR | Status: DC | PRN
  Administered 2016-09-27: 30 mL

## 2016-09-27 MED ORDER — METOCLOPRAMIDE HCL 5 MG PO TABS: 5.0000 mg | ORAL_TABLET | Freq: Three times a day (TID) | ORAL | Status: DC | PRN

## 2016-09-27 MED ORDER — MIDAZOLAM HCL 2 MG/2ML IJ SOLN
INTRAMUSCULAR | Status: AC
  Filled 2016-09-27: qty 2

## 2016-09-27 MED ORDER — HYDROMORPHONE HCL 1 MG/ML IJ SOLN: 0.5000 mg | INTRAMUSCULAR | Status: DC | PRN

## 2016-09-27 MED ORDER — BUPIVACAINE HCL (PF) 0.5 % IJ SOLN
INTRAMUSCULAR | Status: AC
  Filled 2016-09-27: qty 30

## 2016-09-27 MED ORDER — ONDANSETRON HCL 4 MG/2ML IJ SOLN: 4.0000 mg | Freq: Four times a day (QID) | INTRAMUSCULAR | Status: DC | PRN

## 2016-09-27 MED ORDER — ACETAMINOPHEN 650 MG RE SUPP: 650.0000 mg | Freq: Four times a day (QID) | RECTAL | Status: DC | PRN

## 2016-09-27 MED ORDER — KETOROLAC TROMETHAMINE 30 MG/ML IJ SOLN
INTRAMUSCULAR | Status: AC
  Filled 2016-09-27: qty 1

## 2016-09-27 MED ORDER — KETOROLAC TROMETHAMINE 15 MG/ML IJ SOLN
15.0000 mg | Freq: Four times a day (QID) | INTRAMUSCULAR | Status: DC
  Administered 2016-09-28 (×2): 15 mg via INTRAVENOUS
  Filled 2016-09-27 (×2): qty 1

## 2016-09-27 MED ORDER — SODIUM CHLORIDE 0.9 % IJ SOLN
INTRAMUSCULAR | Status: AC
  Filled 2016-09-27: qty 50

## 2016-09-27 MED ORDER — ASPIRIN 81 MG PO CHEW
81.0000 mg | CHEWABLE_TABLET | Freq: Two times a day (BID) | ORAL | Status: DC
  Administered 2016-09-28: 08:00:00 81 mg via ORAL
  Filled 2016-09-27: qty 1

## 2016-09-27 MED ORDER — CEFAZOLIN SODIUM-DEXTROSE 2-4 GM/100ML-% IV SOLN
INTRAVENOUS | Status: AC
  Filled 2016-09-27: qty 100

## 2016-09-27 MED ORDER — MIDAZOLAM HCL 2 MG/2ML IJ SOLN
INTRAMUSCULAR | Status: DC | PRN
  Administered 2016-09-27 (×2): 1 mg via INTRAVENOUS

## 2016-09-27 MED ORDER — PROPOFOL 500 MG/50ML IV EMUL
INTRAVENOUS | Status: DC | PRN
  Administered 2016-09-27: 100 ug/kg/min via INTRAVENOUS

## 2016-09-27 MED ORDER — DEXAMETHASONE SODIUM PHOSPHATE 10 MG/ML IJ SOLN
INTRAMUSCULAR | Status: AC
  Filled 2016-09-27: qty 1

## 2016-09-27 MED ORDER — ALUM & MAG HYDROXIDE-SIMETH 200-200-20 MG/5ML PO SUSP
30.0000 mL | ORAL | Status: DC | PRN
Start: 2016-09-27 — End: 2016-09-28

## 2016-09-27 MED ORDER — DIPHENHYDRAMINE HCL 12.5 MG/5ML PO ELIX: 12.5000 mg | ORAL_SOLUTION | ORAL | Status: DC | PRN

## 2016-09-27 SURGICAL SUPPLY — 62 items
ADH SKN CLS APL DERMABOND .7 (GAUZE/BANDAGES/DRESSINGS) ×2
BAG SPEC THK2 15X12 ZIP CLS (MISCELLANEOUS)
BAG ZIPLOCK 12X15 (MISCELLANEOUS) IMPLANT
BANDAGE ACE 4X5 VEL STRL LF (GAUZE/BANDAGES/DRESSINGS) ×3 IMPLANT
BANDAGE ACE 6X5 VEL STRL LF (GAUZE/BANDAGES/DRESSINGS) ×3 IMPLANT
BANDAGE ESMARK 6X9 LF (GAUZE/BANDAGES/DRESSINGS) ×1 IMPLANT
BLADE SAW RECIPROCATING 77.5 (BLADE) ×3 IMPLANT
BNDG CMPR 9X6 STRL LF SNTH (GAUZE/BANDAGES/DRESSINGS) ×1
BNDG ESMARK 6X9 LF (GAUZE/BANDAGES/DRESSINGS) ×3
CAPT KNEE TRIATH TK-4 ×2 IMPLANT
CHLORAPREP W/TINT 26ML (MISCELLANEOUS) ×6 IMPLANT
CUFF TOURN SGL QUICK 34 (TOURNIQUET CUFF) ×3
CUFF TRNQT CYL 34X4X40X1 (TOURNIQUET CUFF) ×1 IMPLANT
DECANTER SPIKE VIAL GLASS SM (MISCELLANEOUS) ×6 IMPLANT
DERMABOND ADVANCED (GAUZE/BANDAGES/DRESSINGS) ×4
DERMABOND ADVANCED .7 DNX12 (GAUZE/BANDAGES/DRESSINGS) ×2 IMPLANT
DRAPE SHEET LG 3/4 BI-LAMINATE (DRAPES) ×6 IMPLANT
DRAPE U-SHAPE 47X51 STRL (DRAPES) ×3 IMPLANT
DRSG AQUACEL AG ADV 3.5X10 (GAUZE/BANDAGES/DRESSINGS) ×3 IMPLANT
DRSG TEGADERM 4X4.75 (GAUZE/BANDAGES/DRESSINGS) IMPLANT
ELECT REM PT RETURN 9FT ADLT (ELECTROSURGICAL) ×3
ELECTRODE REM PT RTRN 9FT ADLT (ELECTROSURGICAL) ×1 IMPLANT
EVACUATOR 1/8 PVC DRAIN (DRAIN) IMPLANT
GAUZE SPONGE 4X4 12PLY STRL (GAUZE/BANDAGES/DRESSINGS) ×3 IMPLANT
GLOVE BIO SURGEON STRL SZ8.5 (GLOVE) ×6 IMPLANT
GLOVE BIOGEL PI IND STRL 8.5 (GLOVE) ×1 IMPLANT
GLOVE BIOGEL PI INDICATOR 8.5 (GLOVE) ×2
GOWN SPEC L3 XXLG W/TWL (GOWN DISPOSABLE) ×3 IMPLANT
HANDPIECE INTERPULSE COAX TIP (DISPOSABLE) ×3
HOOD PEEL AWAY FLYTE STAYCOOL (MISCELLANEOUS) ×6 IMPLANT
LIQUID BAND (GAUZE/BANDAGES/DRESSINGS) ×4 IMPLANT
MARKER SKIN DUAL TIP RULER LAB (MISCELLANEOUS) ×6 IMPLANT
NDL SPNL 18GX3.5 QUINCKE PK (NEEDLE) ×1 IMPLANT
NEEDLE SPNL 18GX3.5 QUINCKE PK (NEEDLE) ×3 IMPLANT
NS IRRIG 1000ML POUR BTL (IV SOLUTION) ×3 IMPLANT
PACK TOTAL KNEE CUSTOM (KITS) ×3 IMPLANT
PADDING CAST COTTON 6X4 STRL (CAST SUPPLIES) ×3 IMPLANT
POSITIONER SURGICAL ARM (MISCELLANEOUS) ×3 IMPLANT
SAW OSC TIP CART 19.5X105X1.3 (SAW) ×3 IMPLANT
SEALER BIPOLAR AQUA 6.0 (INSTRUMENTS) ×3 IMPLANT
SET HNDPC FAN SPRY TIP SCT (DISPOSABLE) ×1 IMPLANT
SET PAD KNEE POSITIONER (MISCELLANEOUS) ×3 IMPLANT
SOL PREP POV-IOD 4OZ 10% (MISCELLANEOUS) ×3 IMPLANT
SPONGE DRAIN TRACH 4X4 STRL 2S (GAUZE/BANDAGES/DRESSINGS) IMPLANT
SPONGE LAP 18X18 X RAY DECT (DISPOSABLE) IMPLANT
SUCTION FRAZIER HANDLE 12FR (TUBING) ×2
SUCTION TUBE FRAZIER 12FR DISP (TUBING) ×1 IMPLANT
SUT MNCRL AB 3-0 PS2 18 (SUTURE) ×3 IMPLANT
SUT MON AB 2-0 CT1 36 (SUTURE) ×6 IMPLANT
SUT STRATAFIX PDO 1 14 VIOLET (SUTURE) ×3
SUT STRATFX PDO 1 14 VIOLET (SUTURE) ×1
SUT VIC AB 1 CT1 36 (SUTURE) ×9 IMPLANT
SUT VIC AB 2-0 CT1 27 (SUTURE) ×3
SUT VIC AB 2-0 CT1 TAPERPNT 27 (SUTURE) ×1 IMPLANT
SUTURE STRATFX PDO 1 14 VIOLET (SUTURE) ×1 IMPLANT
SYR 50ML LL SCALE MARK (SYRINGE) ×3 IMPLANT
TOWER CARTRIDGE SMART MIX (DISPOSABLE) IMPLANT
TRAY FOLEY W/METER SILVER 16FR (SET/KITS/TRAYS/PACK) IMPLANT
WATER STERILE IRR 1000ML POUR (IV SOLUTION) ×4 IMPLANT
WATER STERILE IRR 1500ML POUR (IV SOLUTION) ×3 IMPLANT
WRAP KNEE MAXI GEL POST OP (GAUZE/BANDAGES/DRESSINGS) ×3 IMPLANT
YANKAUER SUCT BULB TIP 10FT TU (MISCELLANEOUS) ×3 IMPLANT

## 2016-09-27 NOTE — Progress Notes (Signed)
Preop exam showed nonpaplable pulses. Biphasic doppler signal DP and PT.  Postop doppler exam unchanged.

## 2016-09-27 NOTE — Transfer of Care (Signed)
Immediate Anesthesia Transfer of Care Note  Patient: Ricky Bonilla  Procedure(s) Performed: Procedure(s) with comments: RIGHT TOTAL KNEE ARTHROPLASTY WITH COMPUTER NAVIGATION (Right) - Combined spinal/epidural+regional block  Patient Location: PACU  Anesthesia Type:Spinal  Level of Consciousness:  sedated, patient cooperative and responds to stimulation  Airway & Oxygen Therapy:Patient Spontanous Breathing and Patient connected to face mask oxgen  Post-op Assessment:  Report given to PACU RN and Post -op Vital signs reviewed and stable  Post vital signs:  Reviewed and stable  Last Vitals:  Vitals:   09/27/16 1358  BP: (!) 154/95  Pulse: (!) 59  Resp: 18  Temp: 36.6 C    Complications: No apparent anesthesia complications

## 2016-09-27 NOTE — Discharge Instructions (Signed)
° °Dr. Tasheka Houseman °Total Joint Specialist °[facility] °[address] °[place], [place] [address] °[phone] ° °TOTAL KNEE REPLACEMENT POSTOPERATIVE DIRECTIONS ° ° ° °Knee Rehabilitation, Guidelines Following Surgery  °Results after knee surgery are often greatly improved when you follow the exercise, range of motion and muscle strengthening exercises prescribed by your doctor. Safety measures are also important to protect the knee from further injury. Any time any of these exercises cause you to have increased pain or swelling in your knee joint, decrease the amount until you are comfortable again and slowly increase them. If you have problems or questions, call your caregiver or physical therapist for advice.  ° °WEIGHT BEARING °Weight bearing as tolerated with assist device (walker, cane, etc) as directed, use it as long as suggested by your surgeon or therapist, typically at least 4-6 weeks. ° °HOME CARE INSTRUCTIONS  °Remove items at home which could result in a fall. This includes throw rugs or furniture in walking pathways.  °Continue medications as instructed at time of discharge. °You may have some home medications which will be placed on hold until you complete the course of blood thinner medication.  °You may start showering once you are discharged home but do not submerge the incision under water. Just pat the incision dry and apply a dry gauze dressing on daily. °Walk with walker as instructed.  °You may resume a sexual relationship in one month or when given the OK by your doctor.  °· Use walker as long as suggested by your caregivers. °· Avoid periods of inactivity such as sitting longer than an hour when not asleep. This helps prevent blood clots.  °You may put full weight on your legs and walk as much as is comfortable.  °You may return to work once you are cleared by your doctor.  °Do not drive a car for 6 weeks or until released by you surgeon.  °· Do not drive  while taking narcotics.  °Wear the elastic stockings for three weeks following surgery during the day but you may remove then at night. °Make sure you keep all of your appointments after your operation with all of your doctors and caregivers. You should call the office at the above phone number and make an appointment for approximately two weeks after the date of your surgery. °Do not remove your surgical dressing. The dressing is waterproof; you may take showers in 3 days, but do not take tub baths or submerge the dressing. °Please pick up a stool softener and laxative for home use as long as you are requiring pain medications. °· ICE to the affected knee every three hours for 30 minutes at a time and then as needed for pain and swelling.  Continue to use ice on the knee for pain and swelling from surgery. You may notice swelling that will progress down to the foot and ankle.  This is normal after surgery.  Elevate the leg when you are not up walking on it.   °It is important for you to complete the blood thinner medication as prescribed by your doctor. °· Continue to use the breathing machine which will help keep your temperature down.  It is common for your temperature to cycle up and down following surgery, especially at night when you are not up moving around and exerting yourself.  The breathing machine keeps your lungs expanded and your temperature down. ° °RANGE OF MOTION AND STRENGTHENING EXERCISES  °Rehabilitation of the knee is important following   a knee injury or an operation. After just a few days of immobilization, the muscles of the thigh which control the knee become weakened and shrink (atrophy). Knee exercises are designed to build up the tone and strength of the thigh muscles and to improve knee motion. Often times heat used for twenty to thirty minutes before working out will loosen up your tissues and help with improving the range of motion but do not use heat for the first two weeks following  surgery. These exercises can be done on a training (exercise) mat, on the floor, on a table or on a bed. Use what ever works the best and is most comfortable for you Knee exercises include:  °Leg Lifts - While your knee is still immobilized in a splint or cast, you can do straight leg raises. Lift the leg to 60 degrees, hold for 3 sec, and slowly lower the leg. Repeat 10-20 times 2-3 times daily. Perform this exercise against resistance later as your knee gets better.  °Quad and Hamstring Sets - Tighten up the muscle on the front of the thigh (Quad) and hold for 5-10 sec. Repeat this 10-20 times hourly. Hamstring sets are done by pushing the foot backward against an object and holding for 5-10 sec. Repeat as with quad sets.  °A rehabilitation program following serious knee injuries can speed recovery and prevent re-injury in the future due to weakened muscles. Contact your doctor or a physical therapist for more information on knee rehabilitation.  ° °SKILLED REHAB INSTRUCTIONS: °If the patient is transferred to a skilled rehab facility following release from the hospital, a list of the current medications will be sent to the facility for the patient to continue.  When discharged from the skilled rehab facility, please have the facility set up the patient's Home Health Physical Therapy prior to being released. Also, the skilled facility will be responsible for providing the patient with their medications at time of release from the facility to include their pain medication, the muscle relaxants, and their blood thinner medication. If the patient is still at the rehab facility at time of the two week follow up appointment, the skilled rehab facility will also need to assist the patient in arranging follow up appointment in our office and any transportation needs. ° °MAKE SURE YOU:  °Understand these instructions.  °Will watch your condition.  °Will get help right away if you are not doing well or get worse.   ° ° °Pick up stool softner and laxative for home use following surgery while on pain medications. °Do NOT remove your dressing. You may shower.  °Do not take tub baths or submerge incision under water. °May shower starting three days after surgery. °Please use a clean towel to pat the incision dry following showers. °Continue to use ice for pain and swelling after surgery. °Do not use any lotions or creams on the incision until instructed by your surgeon. ° °

## 2016-09-27 NOTE — Anesthesia Postprocedure Evaluation (Signed)
Anesthesia Post Note  Patient: Ricky Bonilla  Procedure(s) Performed: Procedure(s) (LRB): RIGHT TOTAL KNEE ARTHROPLASTY WITH COMPUTER NAVIGATION (Right)  Patient location during evaluation: PACU Anesthesia Type: Spinal Level of consciousness: awake Pain management: satisfactory to patient Vital Signs Assessment: post-procedure vital signs reviewed and stable Respiratory status: spontaneous breathing Cardiovascular status: blood pressure returned to baseline Postop Assessment: no headache and spinal receding Anesthetic complications: no       Last Vitals:  Vitals:   09/27/16 1915 09/27/16 1938  BP: 132/81 (!) 150/78  Pulse: (!) 56 (!) 55  Resp: 13 15  Temp: 36.6 C 36.7 C    Last Pain:  Vitals:   09/27/16 1938  TempSrc: Oral  PainSc:     LLE Motor Response: Purposeful movement (09/27/16 1940) LLE Sensation: Tingling;Increased (09/27/16 1940) RLE Motor Response: Purposeful movement (09/27/16 1940) RLE Sensation: Tingling;Increased (09/27/16 1940) L Sensory Level: S1-Sole of foot, small toes (09/27/16 1940) R Sensory Level: S1-Sole of foot, small toes (09/27/16 1940)  Ricky Bonilla,Ricky Bonilla

## 2016-09-27 NOTE — Anesthesia Procedure Notes (Addendum)
Spinal  Patient location during procedure: OR Staffing Anesthesiologist: Cristela BlueJACKSON, Melburn Treiber Preanesthetic Checklist Completed: patient identified, site marked, surgical consent, pre-op evaluation, timeout performed, IV checked, risks and benefits discussed and monitors and equipment checked Spinal Block Patient position: sitting Prep: DuraPrep Patient monitoring: heart rate, cardiac monitor, continuous pulse ox and blood pressure Approach: midline Location: L3-4 Injection technique: single-shot Needle Needle type: Sprotte and Tuohy  Needle gauge: 24 G Needle length: 9 cm Needle insertion depth: 8 cm Catheter at skin depth: 15 cm Assessment Sensory level: T6 Additional Notes 24 sprotte thru 17 GA tuohy +CSF asp Catheter threads easily; (-) asp   2.0 cc .75% Marcaine SAB dose

## 2016-09-27 NOTE — Anesthesia Preprocedure Evaluation (Addendum)
Anesthesia Evaluation  Patient identified by MRN, date of birth, ID band Patient awake    Reviewed: Allergy & Precautions, H&P , Patient's Chart, lab work & pertinent test results, reviewed documented beta blocker date and time   Airway Mallampati: II  TM Distance: >3 FB Neck ROM: full    Dental no notable dental hx.    Pulmonary former smoker,    Pulmonary exam normal breath sounds clear to auscultation       Cardiovascular hypertension, On Medications  Rhythm:regular Rate:Normal     Neuro/Psych    GI/Hepatic GERD  ,  Endo/Other    Renal/GU      Musculoskeletal   Abdominal   Peds  Hematology   Anesthesia Other Findings   Reproductive/Obstetrics                            Anesthesia Physical Anesthesia Plan  ASA: II  Anesthesia Plan: Spinal, Epidural and Combined Spinal and Epidural   Post-op Pain Management:  Regional for Post-op pain   Induction:   Airway Management Planned:   Additional Equipment:   Intra-op Plan:   Post-operative Plan:   Informed Consent: I have reviewed the patients History and Physical, chart, labs and discussed the procedure including the risks, benefits and alternatives for the proposed anesthesia with the patient or authorized representative who has indicated his/her understanding and acceptance.   Dental Advisory Given  Plan Discussed with: CRNA  Anesthesia Plan Comments: (Lab work and procedure  confirmed with CRNA in room; platelets okay. Discussed spinal anesthetic, and patient consents to the procedure:  included risk of possible headache,backache, failed block, allergic reaction, and nerve injury. This patient was asked if she had any questions or concerns before the procedure started.  )       Anesthesia Quick Evaluation

## 2016-09-27 NOTE — H&P (View-Only) (Signed)
TOTAL KNEE ADMISSION H&P  Patient is being admitted for right total knee arthroplasty.  Subjective:  Chief Complaint:right knee pain.  HPI: Ricky Bonilla, 65 y.o. male, has a history of pain and functional disability in the right knee due to arthritis and has failed non-surgical conservative treatments for greater than 12 weeks to includeNSAID's and/or analgesics, corticosteriod injections, flexibility and strengthening excercises, use of assistive devices, weight reduction as appropriate and activity modification.  Onset of symptoms was gradual, starting 5 years ago with gradually worsening course since that time. The patient noted no past surgery on the right knee(s).  Patient currently rates pain in the right knee(s) at 10 out of 10 with activity. Patient has night pain, worsening of pain with activity and weight bearing, pain that interferes with activities of daily living, pain with passive range of motion and joint swelling.  Patient has evidence of subchondral cysts, subchondral sclerosis, periarticular osteophytes and joint space narrowing by imaging studies.  There is no active infection.  There are no active problems to display for this patient.  Past Medical History:  Diagnosis Date  . Arthritis   . GERD (gastroesophageal reflux disease)   . Hypertension     Past Surgical History:  Procedure Laterality Date  . COLONOSCOPY    . COLONOSCOPY N/A 08/07/2016   Procedure: COLONOSCOPY;  Surgeon: Franky MachoMark Jenkins, MD;  Location: [facility];  Service: Gastroenterology;  Laterality: N/A;  . left rotator cuff repair       (Not in a hospital admission) No Known Allergies  Social History  Substance Use Topics  . Smoking status: Former Smoker    Packs/day: 1.00    Years: 8.00    Types: Cigarettes  . Smokeless tobacco: Former NeurosurgeonUser  . Alcohol use Yes    Family History  Problem Relation Age of Onset  . Colon cancer Neg Hx      Review of Systems  Constitutional: Negative.   HENT:  Positive for hearing loss. Negative for congestion, ear discharge, ear pain, nosebleeds, sinus pain, sore throat and tinnitus.   Eyes: Negative.   Respiratory: Negative.  Negative for stridor.   Cardiovascular: Negative.   Gastrointestinal: Negative.   Genitourinary: Negative.   Musculoskeletal: Positive for joint pain.  Skin: Positive for itching and rash.  Neurological: Negative.   Endo/Heme/Allergies: Negative.   Psychiatric/Behavioral: Negative.     Objective:  Physical Exam  Vitals reviewed. Constitutional: He is oriented to person, place, and time. He appears well-developed and well-nourished.  HENT:  Head: Normocephalic and atraumatic.  Eyes: Conjunctivae are normal. Pupils are equal, round, and reactive to light.  Neck: Normal range of motion. Neck supple.  Cardiovascular: Normal rate, regular rhythm and intact distal pulses.   Respiratory: Effort normal. No respiratory distress.  GI: Soft. He exhibits no distension.  Genitourinary:  Genitourinary Comments: deferred  Musculoskeletal:       Right knee: He exhibits decreased range of motion and swelling. Tenderness found. Medial joint line tenderness noted.  8-110  Neurological: He is alert and oriented to person, place, and time. He has normal reflexes.  Skin: Skin is warm and dry.  Psychiatric: He has a normal mood and affect. His behavior is normal. Judgment and thought content normal.    Vital signs in last 24 hours: @VSRANGES @  Labs:   Estimated body mass index is 31.74 kg/m as calculated from the following:   Height as of 08/07/16: 6' (1.829 m).   Weight as of 08/07/16: 106.1 kg (234 lb).  Imaging Review Plain radiographs demonstrate severe degenerative joint disease of the right knee(s). The overall alignment issignificant varus. The bone quality appears to be adequate for age and reported activity level.  Assessment/Plan:  End stage arthritis, right knee   The patient history, physical examination,  clinical judgment of the provider and imaging studies are consistent with end stage degenerative joint disease of the right knee(s) and total knee arthroplasty is deemed medically necessary. The treatment options including medical management, injection therapy arthroscopy and arthroplasty were discussed at length. The risks and benefits of total knee arthroplasty were presented and reviewed. The risks due to aseptic loosening, infection, stiffness, patella tracking problems, thromboembolic complications and other imponderables were discussed. The patient acknowledged the explanation, agreed to proceed with the plan and consent was signed. Patient is being admitted for inpatient treatment for surgery, pain control, PT, OT, prophylactic antibiotics, VTE prophylaxis, progressive ambulation and ADL's and discharge planning. The patient is planning to be discharged home with home health services  

## 2016-09-27 NOTE — Anesthesia Procedure Notes (Signed)
Anesthesia Regional Block:  Adductor canal block  Pre-Anesthetic Checklist: ,, timeout performed, Correct Patient, Correct Site, Correct Laterality, Correct Procedure, Correct Position, site marked, Risks and benefits discussed, pre-op evaluation,  At surgeon's request and post-op pain management  Laterality: Right  Prep: chloraprep       Needles:   Needle Type: Echogenic Needle     Needle Length: 9cm 9 cm Needle Gauge: 21 and 21 G    Additional Needles:  Procedures: ultrasound guided (picture in chart) Adductor canal block Narrative:  Start time: 09/27/2016 3:17 PM End time: 09/27/2016 3:25 PM Injection made incrementally with aspirations every 5 mL.  Performed by: Personally  Anesthesiologist: Cristela BlueJACKSON, Dravyn Severs

## 2016-09-27 NOTE — Interval H&P Note (Signed)
History and Physical Interval Note:  09/27/2016 3:23 PM  Ricky Bonilla  has presented today for surgery, with the diagnosis of RIGHT KNEE OA  The various methods of treatment have been discussed with the patient and family. After consideration of risks, benefits and other options for treatment, the patient has consented to  Procedure(s): RIGHT TOTAL KNEE ARTHROPLASTY WITH COMPUTER NAVIGATION (Right) as a surgical intervention .  The patient's history has been reviewed, patient examined, no change in status, stable for surgery.  I have reviewed the patient's chart and labs.  Questions were answered to the patient's satisfaction.     Clayvon Parlett, Cloyde ReamsBrian Dionis

## 2016-09-27 NOTE — Op Note (Signed)
OPERATIVE REPORT  SURGEON: Samson FredericBrian Shermon Bozzi, MD   ASSISTANT: Staff  PREOPERATIVE DIAGNOSIS: Right knee arthritis.   POSTOPERATIVE DIAGNOSIS: Right knee arthritis.   PROCEDURE: Right total knee arthroplasty.   IMPLANTS: Stryker Triathlon CR femur, size 7. Stryker Tritanium tibia, size 7. X3 polyethelyene insert, size 11 mm, CR. 3 button asymmetric patella, size 40 mm.  ANESTHESIA:  Regional and Spinal  TOURNIQUET TIME: Not utilized.   ESTIMATED BLOOD LOSS:-* No blood loss amount entered *    ANTIBIOTICS: 2 g Ancef.  DRAINS: None.  COMPLICATIONS: None   CONDITION: PACU - hemodynamically stable.   BRIEF CLINICAL NOTE: Ricky Bonilla is a 65 y.o. male with a long-standing history of Right knee arthritis. After failing conservative management, the patient was indicated for total knee arthroplasty. The risks, benefits, and alternatives to the procedure were explained, and the patient elected to proceed.  PROCEDURE IN DETAIL: Adductor canal block was placed in the pre-op holding area. Once inside the operative room, spinal anesthesia was obtained, and a foley catheter was inserted. The patient was then positioned, a nonsterile tourniquet was placed, and the lower extremity was prepped and draped in the normal sterile surgical fashion. A time-out was called verifying side and site of surgery. The patient received IV antibiotics within 60 minutes of beginning the procedure. The tourniquet was not utilized.  An anterior approach to the knee was performed utilizing a midvastus arthrotomy. A medial release was performed and the patellar fat pad was excised. Stryker navigation was used to cut the distal femur perpendicular to the mechanical axis. A freehand patellar resection was performed, and the patella was sized an prepared with 3 lug holes.  Nagivation was used to make a neutral proximal  tibia resection, taking 8 mm of bone from the less affected lateral side with 3 degrees of slope. The menisci were excised. A spacer block was placed, and the alignment and balance in extension were confirmed.   The distal femur was sized using the 3-degree external rotation guide referencing the posterior femoral cortex. The appropriate 4-in-1 cutting block was pinned into place. Rotation was checked using Whiteside's line, the epicondylar axis, and then confirmed with a spacer block in flexion. The remaining femoral cuts were performed, taking care to protect the MCL.  The tibia was sized and the trial tray was pinned into place. The remaining trail components were inserted. The knee was stable to varus and valgus stress through a full range of motion. The patella tracked centrally, and the PCL was well balanced. The trial components were removed, and the proximal tibial surface was prepared. Final components were impacted into place. The knee was tested for a final time and found to be well balanced.  The wound was copiously irrigated with a dilute betadine solution followed by normal saline with pulse lavage. Marcaine solution was injected into the periarticular soft tissue. The wound was closed in layers using #1 Vicryl and Stratafix for the fascia, 2-0 Vicryl for the subcutaneous fat, 2-0 Monocryl for the deep dermal layer, 3-0 running Monocryl subcuticular Stitch, and Dermabond for the skin. Once the glue was fully dried, an Aquacell Ag and compressive dressing were applied. Tthe patient was transported to the recovery room in stable condition. Sponge, needle, and instrument counts were correct at the end of the case x2. The patient tolerated the procedure well and there were no known complications.

## 2016-09-28 ENCOUNTER — Encounter (HOSPITAL_COMMUNITY): Payer: Self-pay | Admitting: Orthopedic Surgery

## 2016-09-28 LAB — BASIC METABOLIC PANEL
ANION GAP: 8 (ref 5–15)
BUN: 18 mg/dL (ref 6–20)
CALCIUM: 8.7 mg/dL — AB (ref 8.9–10.3)
CO2: 25 mmol/L (ref 22–32)
Chloride: 106 mmol/L (ref 101–111)
Creatinine, Ser: 0.82 mg/dL (ref 0.61–1.24)
GFR calc non Af Amer: >60 mL/min (ref >60)
Glucose, Bld: 140 mg/dL — ABNORMAL HIGH (ref 65–99)
POTASSIUM: 4.3 mmol/L (ref 3.5–5.1)
Sodium: 139 mmol/L (ref 135–145)

## 2016-09-28 LAB — CBC
HEMATOCRIT: 38 % — AB (ref 39.0–52.0)
HEMOGLOBIN: 13.4 g/dL (ref 13.0–17.0)
MCH: 31.2 pg (ref 26.0–34.0)
MCHC: 35.3 g/dL (ref 30.0–36.0)
MCV: 88.6 fL (ref 78.0–100.0)
Platelets: 182 10*3/uL (ref 150–400)
RBC: 4.29 MIL/uL (ref 4.22–5.81)
RDW: 13.3 % (ref 11.5–15.5)
WBC: 11.7 10*3/uL — AB (ref 4.0–10.5)

## 2016-09-28 MED ORDER — DOCUSATE SODIUM 100 MG PO CAPS: 100.0000 mg | ORAL_CAPSULE | Freq: Two times a day (BID) | ORAL | 0 refills | Status: DC

## 2016-09-28 MED ORDER — SENNA 8.6 MG PO TABS: 2.0000 | ORAL_TABLET | Freq: Every day | ORAL | 0 refills | Status: DC

## 2016-09-28 MED ORDER — ONDANSETRON HCL 4 MG PO TABS: 4.0000 mg | ORAL_TABLET | Freq: Four times a day (QID) | ORAL | 0 refills | Status: DC | PRN

## 2016-09-28 MED ORDER — ASPIRIN 81 MG PO CHEW: 81.0000 mg | CHEWABLE_TABLET | Freq: Two times a day (BID) | ORAL | 1 refills | Status: DC

## 2016-09-28 MED ORDER — LISINOPRIL 20 MG PO TABS
40.0000 mg | ORAL_TABLET | Freq: Every day | ORAL | Status: DC
  Filled 2016-09-28: qty 2

## 2016-09-28 MED ORDER — METHOCARBAMOL 500 MG PO TABS: 500.0000 mg | ORAL_TABLET | Freq: Four times a day (QID) | ORAL | 0 refills | Status: DC | PRN

## 2016-09-28 MED ORDER — HYDROCODONE-ACETAMINOPHEN 5-325 MG PO TABS: 1.0000 | ORAL_TABLET | ORAL | 0 refills | Status: DC | PRN

## 2016-09-28 NOTE — Care Management Note (Signed)
Case Management Note  Patient Details  Name: Ricky Bonilla MRN: [id number] Date of Birth: 12/02/1950  Subjective/Objective:  [facility] rep Marchelle FolksAmanda aware of d/c home w/HHPT,they were already following from office. No further CM needs.                  Action/Plan:d/c home w/HHPT   Expected Discharge Date:                  Expected Discharge Plan:  Home w Home Health Services  In-House Referral:     Discharge planning Services  CM Consult  Post Acute Care Choice:    Choice offered to:  Patient  DME Arranged:    DME Agency:     HH Arranged:  PT HH Agency:  [facility]  Status of Service:  Completed, signed off  If discussed at [employer] Meetings, dates discussed:    Additional Comments:  Lanier ClamMahabir, Sheri Prows, RN 09/28/2016, 2:17 PM

## 2016-09-28 NOTE — Evaluation (Signed)
Occupational Therapy Evaluation Patient Details Name: Lonell FaceJames R Liberatore MRN: [id number] DOB: 04/26/1951 Today's Date: 09/28/2016    History of Present Illness s/p R TKA   Clinical Impression   This 65 year old man was admitted for the above sx. All education was completed. No further OT is needed at this time    Follow Up Recommendations  Supervision/Assistance - 24 hour    Equipment Recommendations   (pt does not want bathroom DME)    Recommendations for Other Services       Precautions / Restrictions Precautions Precautions: Fall;Knee Restrictions Weight Bearing Restrictions: No      Mobility Bed Mobility               General bed mobility comments: oob  Transfers Overall transfer level: Needs assistance Equipment used: Rolling walker (2 wheeled) Transfers: Sit to/from Stand Sit to Stand: Min guard         General transfer comment: for safety.  Cues to extend RLE    Balance                                            ADL Overall ADL's : Needs assistance/impaired     Grooming: Supervision/safety       Lower Body Bathing: Minimal assistance;Sit to/from stand       Lower Body Dressing: Minimal assistance;Sit to/from stand   Toilet Transfer: Min guard;Ambulation;BSC;RW;Comfort height toilet             General ADL Comments: pt has a standard commode with nothing to hold on to.  Performed comfort height transfer using RW and toilet seat.  He wants to try his standard commode at home and will let HHPT know if he needs 3:1.  He has both walk in and tub/shower units.  Educated on both, but shower stall will be easier for him to get into.  Educated that if he performs a standing shower, to wash lower legs when sitting on toilet or seat outside of shower so that he doesn't have to stand on one leg.  Reviewed sidestepping through tight spaces as well as knee precautions     Vision     Perception     Praxis      Pertinent  Vitals/Pain Pain Assessment: Faces Pain Score: 2  Faces Pain Scale: Hurts a little bit Pain Location: R knee Pain Descriptors / Indicators: Sore Pain Intervention(s): Limited activity within patient's tolerance;Monitored during session;Premedicated before session;Repositioned;Ice applied     Hand Dominance     Extremity/Trunk Assessment Upper Extremity Assessment Upper Extremity Assessment: Overall WFL for tasks assessed           Communication Communication Communication: No difficulties   Cognition Arousal/Alertness: Awake/alert Behavior During Therapy: WFL for tasks assessed/performed Overall Cognitive Status: Within Functional Limits for tasks assessed                     General Comments       Exercises       Shoulder Instructions      Home Living Family/patient expects to be discharged to:: Private residence Living Arrangements: Spouse/significant other Available Help at Discharge: Family               Bathroom Shower/Tub: Walk-in shower;Tub/shower unit   Bathroom Toilet: Standard     Home Equipment: None   Additional Comments: pt  does not have anything next to toilet to push up from      Prior Functioning/Environment Level of Independence: Independent                 OT Problem List:     OT Treatment/Interventions:      OT Goals(Current goals can be found in the care plan section) Acute Rehab OT Goals Patient Stated Goal: return to independence OT Goal Formulation: All assessment and education complete, DC therapy  OT Frequency:     Barriers to D/C:            Co-evaluation              End of Session    Activity Tolerance: Patient tolerated treatment well Patient left: in chair;with call bell/phone within reach;with family/visitor present   Time: [id number] OT Time Calculation (min): 8 min Charges:  OT General Charges $OT Visit: 1 Procedure OT Evaluation $OT Eval Low Complexity: 1 Procedure G-Codes:     Rissa Turley 09/28/2016, 10:26 AM  Marica OtterMaryellen Alizee Maple, OTR/L [phone] 09/28/2016

## 2016-09-28 NOTE — Progress Notes (Signed)
   Subjective:  Patient reports pain as mild to moderate.  Denies N/V/CP/SOB.  Objective:   VITALS:   Vitals:   09/27/16 2145 09/27/16 2250 09/28/16 0120 09/28/16 0515  BP: (!) 142/78 121/81 121/75 134/80  Pulse: 65 66 66 63  Resp: 16 16 16 14   Temp: 98 F (36.7 C) 97.4 F (36.3 C) 98.5 F (36.9 C) 97.6 F (36.4 C)  TempSrc: Oral Oral Oral Oral  SpO2: 95% 93% 96% 95%  Weight:      Height:        NAD ABD soft Sensation intact distally Intact pulses distally Dorsiflexion/Plantar flexion intact Incision: dressing C/D/I Compartment soft    Lab Results  Component Value Date   WBC 11.7 (H) 09/28/2016   HGB 13.4 09/28/2016   HCT 38.0 (L) 09/28/2016   MCV 88.6 09/28/2016   PLT 182 09/28/2016   BMET    Component Value Date/Time   NA 139 09/28/2016 0509   K 4.3 09/28/2016 0509   CL 106 09/28/2016 0509   CO2 25 09/28/2016 0509   GLUCOSE 140 (H) 09/28/2016 0509   BUN 18 09/28/2016 0509   CREATININE 0.82 09/28/2016 0509   CALCIUM 8.7 (L) 09/28/2016 0509   GFRNONAA >60 09/28/2016 0509   GFRAA >60 09/28/2016 0509     Assessment/Plan: 1 Day Post-Op   Principal Problem:   Osteoarthritis of right knee   WBAT with walker DVT ppx: ASA, SCDs, TEDs PO pain control PT/OT Dispo: D/C home today vs tomorrow   Garnet KoyanagiSwinteck, Gracianna Vink Male 09/28/2016, 8:07 AM   Samson FredericBrian Arijana Narayan, MD Cell [phone]

## 2016-09-28 NOTE — Progress Notes (Signed)
Physical Therapy Treatment Patient Details Name: Ricky FaceJames R Cuadrado MRN: [id number] DOB: 04/01/1951 Today's Date: 09/28/2016    History of Present Illness s/p R TKA    PT Comments    Pt progressing well with mobility and eager for return home.  Reviewed therex and stairs with pt and family  Follow Up Recommendations  Home health PT     Equipment Recommendations  None recommended by PT    Recommendations for Other Services OT consult     Precautions / Restrictions Precautions Precautions: Fall;Knee Restrictions Weight Bearing Restrictions: No Other Position/Activity Restrictions: WBAT    Mobility  Bed Mobility Overal bed mobility: Needs Assistance Bed Mobility: Supine to Sit     Supine to sit: Supervision     General bed mobility comments: min cues for sequence  Transfers Overall transfer level: Needs assistance Equipment used: Rolling walker (2 wheeled) Transfers: Sit to/from Stand Sit to Stand: Supervision         General transfer comment: cues for LE management and use of UEs to self assist  Ambulation/Gait Ambulation/Gait assistance: Min guard;Supervision Ambulation Distance (Feet): 200 Feet Assistive device: Rolling walker (2 wheeled) Gait Pattern/deviations: Step-to pattern;Shuffle;Trunk flexed Gait velocity: decr Gait velocity interpretation: Below normal speed for age/gender General Gait Details: cues for sequence, stride length and position from RW   Stairs Stairs: Yes   Stair Management: One rail Left;Two rails;Step to pattern;Forwards;With crutches Number of Stairs: 4 General stair comments: 2 stairs with bil rails and 2 stairs with rail and crutch  Wheelchair Mobility    Modified Rankin (Stroke Patients Only)       Balance Overall balance assessment: No apparent balance deficits (not formally assessed)                                  Cognition Arousal/Alertness: Awake/alert Behavior During Therapy: WFL for tasks  assessed/performed Overall Cognitive Status: Within Functional Limits for tasks assessed                      Exercises Total Joint Exercises Ankle Circles/Pumps: AROM;Both;15 reps;Supine Quad Sets: AROM;Both;10 reps;Supine Heel Slides: AAROM;Right;15 reps;Supine Straight Leg Raises: AROM;AAROM;Right;15 reps;Supine    General Comments        Pertinent Vitals/Pain Pain Assessment: 0-10 Pain Score: 3  Pain Location: R knee Pain Descriptors / Indicators: Sore Pain Intervention(s): Limited activity within patient's tolerance;Monitored during session;Premedicated before session;Ice applied    Home Living Family/patient expects to be discharged to:: Private residence Living Arrangements: Spouse/significant other Available Help at Discharge: Family Type of Home: House Home Access: Stairs to enter Entrance Stairs-Rails: Right;Left;Can reach both Home Layout: One level Home Equipment: Environmental consultantWalker - standard      Prior Function Level of Independence: Independent          PT Goals (current goals can now be found in the care plan section) Acute Rehab PT Goals Patient Stated Goal: return to independence PT Goal Formulation: With patient Time For Goal Achievement: 10/02/16 Potential to Achieve Goals: Good Progress towards PT goals: Progressing toward goals    Frequency    7X/week      PT Plan Current plan remains appropriate    Co-evaluation             End of Session Equipment Utilized During Treatment: Gait belt Activity Tolerance: Patient tolerated treatment well Patient left: in chair;with call bell/phone within reach;with chair alarm set;with family/visitor present  Time: 1325-1405 PT Time Calculation (min) (ACUTE ONLY): 40 min  Charges:  $Gait Training: 23-37 mins $Therapeutic Exercise: 8-22 mins                    G Codes:      Paz Fuentes 09/28/2016, 2:50 PM

## 2016-09-28 NOTE — Discharge Summary (Signed)
Physician Discharge Summary  Patient ID: Ricky Bonilla MRN: [id number] DOB/AGE: 65/12/1950 65 y.o.  Admit date: 09/27/2016 Discharge date: 09/28/2016  Admission Diagnoses:  Osteoarthritis of right knee  Discharge Diagnoses:  Principal Problem:   Osteoarthritis of right knee   Past Medical History:  Diagnosis Date  . Arthritis   . GERD (gastroesophageal reflux disease)   . Hypertension     Surgeries: Procedure(s): RIGHT TOTAL KNEE ARTHROPLASTY WITH COMPUTER NAVIGATION on 09/27/2016   Consultants (if any):   Discharged Condition: Improved  Hospital Course: Ricky Bonilla is an 65 y.o. male who was admitted 09/27/2016 with a diagnosis of Osteoarthritis of right knee and went to the operating room on 09/27/2016 and underwent the above named procedures.    He was given perioperative antibiotics:  Anti-infectives    Start     Dose/Rate Route Frequency Ordered Stop   09/28/16 0600  ceFAZolin (ANCEF) IVPB 2g/100 mL premix     2 g 200 mL/hr over 30 Minutes Intravenous On call to O.R. 09/27/16 1357 09/27/16 1620   09/27/16 2200  ceFAZolin (ANCEF) IVPB 2g/100 mL premix     2 g 200 mL/hr over 30 Minutes Intravenous Every 6 hours 09/27/16 1947 09/28/16 0522    .  He was given sequential compression devices, early ambulation, and ASA for DVT prophylaxis.  He benefited maximally from the hospital stay and there were no complications.    Recent vital signs:  Vitals:   09/28/16 0120 09/28/16 0515  BP: 121/75 134/80  Pulse: 66 63  Resp: 16 14  Temp: 98.5 F (36.9 C) 97.6 F (36.4 C)    Recent laboratory studies:  Lab Results  Component Value Date   HGB 13.4 09/28/2016   HGB 14.8 09/19/2016   Lab Results  Component Value Date   WBC 11.7 (H) 09/28/2016   PLT 182 09/28/2016   No results found for: INR Lab Results  Component Value Date   NA 139 09/28/2016   K 4.3 09/28/2016   CL 106 09/28/2016   CO2 25 09/28/2016   BUN 18 09/28/2016   CREATININE 0.82 09/28/2016    GLUCOSE 140 (H) 09/28/2016    Discharge Medications:   Allergies as of 09/28/2016   No Known Allergies     Medication List    STOP taking these medications   aspirin 81 MG tablet Replaced by:  aspirin 81 MG chewable tablet     TAKE these medications   amLODipine 10 MG tablet Commonly known as:  NORVASC Take 10 mg by mouth daily.   aspirin 81 MG chewable tablet Chew 1 tablet (81 mg total) by mouth 2 (two) times daily. Replaces:  aspirin 81 MG tablet   docusate sodium 100 MG capsule Commonly known as:  COLACE Take 1 capsule (100 mg total) by mouth 2 (two) times daily.   fosinopril 40 MG tablet Commonly known as:  MONOPRIL Take 40 mg by mouth daily.   HYDROcodone-acetaminophen 5-325 MG tablet Commonly known as:  NORCO/VICODIN Take 1-2 tablets by mouth every 4 (four) hours as needed (breakthrough pain).   methocarbamol 500 MG tablet Commonly known as:  ROBAXIN Take 1 tablet (500 mg total) by mouth every 6 (six) hours as needed for muscle spasms.   multivitamin with minerals Tabs tablet Take 1 tablet by mouth daily.   omeprazole 40 MG capsule Commonly known as:  PRILOSEC Take 40 mg by mouth daily.   ondansetron 4 MG tablet Commonly known as:  ZOFRAN Take 1 tablet (4 mg  total) by mouth every 6 (six) hours as needed for nausea.   propranolol 80 MG tablet Commonly known as:  INDERAL Take 40-80 mg by mouth daily as needed (tremor).   senna 8.6 MG Tabs tablet Commonly known as:  SENOKOT Take 2 tablets (17.2 mg total) by mouth at bedtime.            Durable Medical Equipment        Start     Ordered   09/27/16 1948  DME Bedside commode  Once    Question:  Patient needs a bedside commode to treat with the following condition  Answer:  S/P total knee replacement, right   09/27/16 1947   09/27/16 1948  DME 3 n 1  Once     09/27/16 1947   09/27/16 1948  DME Walker rolling  Once    Question:  Patient needs a walker to treat with the following condition   Answer:  S/P total knee replacement, right   09/27/16 1947      Diagnostic Studies: Dg Knee Right Port  Result Date: 09/27/2016 CLINICAL DATA:  Status post right total knee arthroplasty EXAM: PORTABLE RIGHT KNEE - 1-2 VIEW COMPARISON:  None. FINDINGS: Status post right total knee arthroplasty with well-positioned right distal femoral, right proximal tibial and right posterior patellar prostheses with no evidence of prosthetic fracture or loosening. No acute osseous fracture. No suspicious focal osseous lesion. No dislocation. Expected soft tissue gas within and surrounding the right knee joint. IMPRESSION: Satisfactory immediate postoperative appearance status post right total knee arthroplasty. Electronically Signed   By: Delbert Phenix M.D.   On: 09/27/2016 19:14    Disposition: 01-Home or Self Care  Discharge Instructions    Call MD / Call 911    Complete by:  As directed    If you experience chest pain or shortness of breath, CALL 911 and be transported to the hospital emergency room.  If you develope a fever above 101 F, pus (white drainage) or increased drainage or redness at the wound, or calf pain, call your surgeon's office.   Constipation Prevention    Complete by:  As directed    Drink plenty of fluids.  Prune juice may be helpful.  You may use a stool softener, such as Colace (over the counter) 100 mg twice a day.  Use MiraLax (over the counter) for constipation as needed.   Diet - low sodium heart healthy    Complete by:  As directed    Do not put a pillow under the knee. Place it under the heel.    Complete by:  As directed    Driving restrictions    Complete by:  As directed    No driving for 6 weeks   Increase activity slowly as tolerated    Complete by:  As directed    Lifting restrictions    Complete by:  As directed    No lifting for 6 weeks   TED hose    Complete by:  As directed    Use stockings (TED hose) for 2 weeks on both leg(s).  You may remove them at night  for sleeping.      Follow-up Information    Romani Wilbon, Cloyde Reams, MD. Schedule an appointment as soon as possible for a visit in 2 weeks.   Specialty:  Orthopedic Surgery Why:  For wound re-check Contact information: [address] [place] [address] [phone]  Signed: Garnet KoyanagiSwinteck, Tomy Khim Macyn 09/28/2016, 8:11 AM

## 2016-09-28 NOTE — Evaluation (Signed)
Physical Therapy Evaluation Patient Details Name: Ricky Bonilla MRN: [id number] DOB: 12/21/1950 Today's Date: 09/28/2016   History of Present Illness  s/p R TKA  Clinical Impression  Pt s/p R TKR presents with decreased R LE strength/ROM and post op pain limiting functional mobility.  Pt should progress to dc home with family assist and HHPT follow up.    Follow Up Recommendations Home health PT    Equipment Recommendations  None recommended by PT    Recommendations for Other Services OT consult     Precautions / Restrictions Precautions Precautions: Fall;Knee Restrictions Weight Bearing Restrictions: No Other Position/Activity Restrictions: WBAT      Mobility  Bed Mobility Overal bed mobility: Needs Assistance Bed Mobility: Supine to Sit     Supine to sit: Supervision     General bed mobility comments: min cues for sequence  Transfers Overall transfer level: Needs assistance Equipment used: Rolling walker (2 wheeled) Transfers: Sit to/from Stand Sit to Stand: Min guard         General transfer comment: cues for LE management and use of UEs to self assist  Ambulation/Gait Ambulation/Gait assistance: Min guard Ambulation Distance (Feet): 75 Feet Assistive device: Rolling walker (2 wheeled) Gait Pattern/deviations: Step-to pattern;Shuffle;Trunk flexed Gait velocity: decr Gait velocity interpretation: Below normal speed for age/gender General Gait Details: cues for sequence, stride length and position from [employer]  Stairs            Wheelchair Mobility    Modified Rankin (Stroke Patients Only)       Balance Overall balance assessment: No apparent balance deficits (not formally assessed)                                           Pertinent Vitals/Pain Pain Assessment: 0-10 Pain Score: 2  Faces Pain Scale: Hurts a little bit Pain Location: R knee Pain Descriptors / Indicators: Sore Pain Intervention(s): Limited activity within  patient's tolerance;Monitored during session;Premedicated before session;Ice applied    Home Living Family/patient expects to be discharged to:: Private residence Living Arrangements: Spouse/significant other Available Help at Discharge: Family Type of Home: House Home Access: Stairs to enter Entrance Stairs-Rails: Right;Left;Can reach both Secretary/administratorntrance Stairs-Number of Steps: 6 Home Layout: One level Home Equipment: Walker - standard Additional Comments: pt does not have anything next to toilet to push up from    Prior Function Level of Independence: Independent               Hand Dominance        Extremity/Trunk Assessment   Upper Extremity Assessment Upper Extremity Assessment: Overall WFL for tasks assessed    Lower Extremity Assessment Lower Extremity Assessment: RLE deficits/detail RLE Deficits / Details: 3/5 quads with IND SLR and AAROM at R knee -10-  100    Cervical / Trunk Assessment Cervical / Trunk Assessment: Normal  Communication   Communication: No difficulties  Cognition Arousal/Alertness: Awake/alert Behavior During Therapy: WFL for tasks assessed/performed Overall Cognitive Status: Within Functional Limits for tasks assessed                      General Comments      Exercises Total Joint Exercises Ankle Circles/Pumps: AROM;Both;15 reps;Supine Quad Sets: AROM;Both;10 reps;Supine Heel Slides: AAROM;Right;15 reps;Supine Straight Leg Raises: AROM;AAROM;Right;15 reps;Supine   Assessment/Plan    PT Assessment Patient needs continued PT services  PT Problem List  Decreased strength;Decreased range of motion;Decreased activity tolerance;Decreased mobility;Pain;Decreased knowledge of use of DME          PT Treatment Interventions DME instruction;Gait training;Stair training;Functional mobility training;Therapeutic activities;Therapeutic exercise;Patient/family education    PT Goals (Current goals can be found in the Care Plan section)   Acute Rehab PT Goals Patient Stated Goal: return to independence PT Goal Formulation: With patient Time For Goal Achievement: 10/02/16 Potential to Achieve Goals: Good    Frequency 7X/week   Barriers to discharge        Co-evaluation               End of Session Equipment Utilized During Treatment: Gait belt Activity Tolerance: Patient tolerated treatment well Patient left: in chair;with call bell/phone within reach;with chair alarm set;with family/visitor present Nurse Communication: Mobility status         Time: 1610-96040811-0845 PT Time Calculation (min) (ACUTE ONLY): 34 min   Charges:   PT Evaluation $PT Eval Low Complexity: 1 Procedure PT Treatments $Therapeutic Exercise: 8-22 mins   PT G Codes:        Ricky Bonilla 09/28/2016, 12:59 PM

## 2016-10-15 ENCOUNTER — Ambulatory Visit (HOSPITAL_COMMUNITY): Payer: BLUE CROSS/BLUE SHIELD | Attending: Orthopedic Surgery

## 2016-10-15 DIAGNOSIS — M25661 Stiffness of right knee, not elsewhere classified: Secondary | ICD-10-CM | POA: Insufficient documentation

## 2016-10-15 DIAGNOSIS — R262 Difficulty in walking, not elsewhere classified: Secondary | ICD-10-CM | POA: Insufficient documentation

## 2016-10-15 DIAGNOSIS — M25561 Pain in right knee: Secondary | ICD-10-CM | POA: Diagnosis not present

## 2016-10-15 NOTE — Therapy (Signed)
[facility] [address] [place], [place], [address] Phone: [phone]   Fax:  [phone]  Physical Therapy Evaluation  Patient Details  Name: Ricky Bonilla MRN: [id number] Date of Birth: Dec 13, 1950 Referring Provider: Linna Caprice  Encounter Date: 10/15/2016      PT End of Session - 10/15/16 1546    Visit Number 1   Number of Visits 12   Date for PT Re-Evaluation 11/05/16   Authorization Type BCBS other    Authorization Time Period 10/15/16- 11/26/16   PT Start Time 1441   PT Stop Time 1519   PT Time Calculation (min) 38 min   Activity Tolerance Patient tolerated treatment well;No increased pain;Patient limited by fatigue   Behavior During Therapy [facility] for tasks assessed/performed      Past Medical History:  Diagnosis Date  . Arthritis   . GERD (gastroesophageal reflux disease)   . Hypertension     Past Surgical History:  Procedure Laterality Date  . APPENDECTOMY    . COLONOSCOPY    . COLONOSCOPY N/A 08/07/2016   Procedure: COLONOSCOPY;  Surgeon: Franky Macho, MD;  [facility];  Service: Gastroenterology;  Laterality: N/A;  . KNEE ARTHROPLASTY Right 09/27/2016   Procedure: RIGHT TOTAL KNEE ARTHROPLASTY WITH COMPUTER NAVIGATION;  Surgeon: Samson Frederic, MD;  Location: [facility];  Service: Orthopedics;  Laterality: Right;  Combined spinal/epidural+regional block  . left rotator cuff repair     and right rotator cuff repair    There were no vitals filed for this visit.       Subjective Assessment - 10/15/16 1445    Subjective Pt reports he underwent a Rt TKA on 09/27/16. Prior to that, pt reports about 12 months of functional decline.    Pertinent History Essential tremor at baseline. Pt reports he underwent a Rt TKA on 09/27/16. Prior to that, pt reports about 12 months of functional decline.  Bilat rotator cuff repair >4 years ago with good return to funciton.    How long can you stand comfortably? 30-45 minutes   How long can you walk comfortably? Pt has been walking household distances only since surgery.    Patient Stated Goals Return to PLOF.    Currently in Pain? Yes   Pain Score 2    Pain Location Knee   Pain Orientation Right   Pain Descriptors / Indicators Aching   Pain Type Surgical pain   Pain Onset 1 to 4 weeks ago   Pain Frequency Intermittent   Aggravating Factors  Prolonged sitting, prolonged walking, HEP.    Pain Relieving Factors Pain meds, activity modification.             [facility] PT Assessment - 10/15/16 0001      Assessment   Medical Diagnosis Rt TKA    Referring Provider Swinteck   Onset Date/Surgical Date 09/27/16   Hand Dominance Right   Next MD Visit First week of feb   Prior Therapy HHPT only, now DC.      Precautions   Precautions None     Restrictions   Weight Bearing Restrictions No     Balance Screen   Has the patient fallen in the past 6 months No   Has the patient had a decrease in activity level because of a fear of falling?  Yes   Is the patient reluctant to leave their home because of a fear of falling?  No     Prior Function   Level of Independence  Independent with basic ADLs;Independent with community mobility without device   Vocation Full time employment  [employer] walking, lifting, carrying loads, ladders, unlevel surfaces. .   Very physical job with variable tasks.      Functional Tests   Functional tests Other     Other:   Other/ Comments TUG: 9.63s     ROM / Strength   AROM / PROM / Strength PROM     PROM   Overall PROM  Other (comment);Deficits  Rt Knee flexion: 16-106 degrees      Ambulation/Gait   Ambulation Distance (Feet) 225 Feet   Assistive device None   Gait Pattern Antalgic   Gait velocity 0.107m/s                   [facility] Adult PT Treatment/Exercise - 10/15/16 0001      Exercises   Exercises Knee/Hip     Knee/Hip Exercises: Seated   Long Arc Quad  Right;Strengthening;AROM;15 reps   Long Arc Quad Limitations HEP     Knee/Hip Exercises: Supine   Quad Sets 10 reps;Right;AROM  10x3sec   Quad Sets Limitations HEP   Short Arc Quad Sets 10 reps;Right;Strengthening;AROM   Short Arc Quad Sets Limitations HEP   Heel Slides Right;AROM  2 minutes   Heel Slides Limitations HEP   Bridges Both;AROM;Strengthening;15 reps   Bridges Limitations HEP   Straight Leg Raises --  DC from HEP until imrpoved TKE ROM   Knee Extension Limitations 16 degrees   Knee Flexion Limitations 106 degrees                PT Education - 10/15/16 1544    Education provided Yes   Education Details The role of sedency on fluid accumulation in the knee joint and how it pertains to achy throbby pain; use of mobility to decrease stiffness.     Person(s) Educated Patient   Methods Explanation;Demonstration   Comprehension Verbalized understanding          PT Short Term Goals - 10/15/16 1552      PT SHORT TERM GOAL #1   Title After 3 weeks pt will demonstrate improved Rt knee flexion ROM on 10-114 degrees to improve mechnics during gait and stairs.    Status New     PT SHORT TERM GOAL #2   Title After 3 weeks patient will demonstrate improved functional strength as evidenced by 5xSTS hands free in <13 seconds.    Status New     PT SHORT TERM GOAL #3   Title After 3 weeks patient will demonstrate improved functional gait AEB 251ft AMB <60 seconds to improve safety during community distances.    Status New           PT Long Term Goals - 10/15/16 1556      PT LONG TERM GOAL #1   Title After 6 weeks patient will demonstrate improved functional strength as evidenced by 5xSTS hands free in <10 seconds.    Status New     PT LONG TERM GOAL #2   Title After 6 weeks pt will demonstrate improved Rt knee flexion ROM on 5-122 degrees to improve mechnics during gait and stairs.    Status New     PT LONG TERM GOAL #3   Title After 6 weeks patient will  demonstrate improved functional gait AEB 260ft AMB <50 seconds to improve safety during community distances.    Status New  Plan - 10/15/16 1547    Clinical Impression Statement Pt presenting 3 weeks after rt TKA. Pt demonstrating Rt knee effusion, with hypomobilirt, stiffness, pain, weakness, neurological inhibition, and healing surgical scar WNL. Pt will benefit from skilled PT intervention to address these and othe rimpairments detail in this note, to return the patient to PLOF in ADL, IADL, and work related mobility tasks.    Rehab Potential Good   PT Frequency 2x / week   PT Duration 6 weeks   PT Treatment/Interventions ADLs/Self Care Home Management;Moist Heat;Therapeutic activities;Therapeutic exercise;Balance training;Dry needling;Energy conservation;Taping;Manual techniques;Manual lymph drainage;Passive range of motion;Patient/family education;Gait training;Stair training;Functional mobility training;DME Instruction   PT Next Visit Plan Repeat HEP, review and discuss home AMB plan, gait training c RW, prone knee hang 3x60sec with 2x15 prone knee flexion A/ROM intermittently.    PT Home Exercise Plan At eval: AMB 2-3x daily 5 minutes, LAQ, SAQ, heel slides, Quad sets, bridging.    Consulted and Agree with Plan of Care Patient      Patient will benefit from skilled therapeutic intervention in order to improve the following deficits and impairments:  Abnormal gait, Decreased range of motion, Difficulty walking, Increased fascial restricitons, Pain, Decreased activity tolerance, Decreased balance, Hypomobility, Decreased scar mobility, Decreased knowledge of use of DME, Decreased mobility, Decreased strength, Increased edema, Improper body mechanics, Increased muscle spasms  Visit Diagnosis: Acute pain of right knee - Plan: PT plan of care cert/re-cert  Stiffness of right knee, not elsewhere classified - Plan: PT plan of care cert/re-cert  Difficulty in walking, not  elsewhere classified - Plan: PT plan of care cert/re-cert     Problem List Patient Active Problem List   Diagnosis Date Noted  . Osteoarthritis of right knee 09/27/2016    4:01 PM, 10/15/16 Rosamaria LintsAllan C Teven Mittman, PT, DPT Physical Therapist at [facility] [phone] (office)     [facility] [address] [place], [place], [address] Phone: [phone]   Fax:  [phone]  Name: Lonell FaceJames R Salvador MRN: [id number] Date of Birth: 09/06/1951

## 2016-10-19 ENCOUNTER — Ambulatory Visit (HOSPITAL_COMMUNITY): Payer: BLUE CROSS/BLUE SHIELD | Admitting: Physical Therapy

## 2016-10-19 DIAGNOSIS — M25561 Pain in right knee: Secondary | ICD-10-CM

## 2016-10-19 DIAGNOSIS — R262 Difficulty in walking, not elsewhere classified: Secondary | ICD-10-CM

## 2016-10-19 DIAGNOSIS — M25661 Stiffness of right knee, not elsewhere classified: Secondary | ICD-10-CM

## 2016-10-19 NOTE — Therapy (Signed)
[facility] [address] [place], [place], [address] Phone: [phone]   Fax:  [phone]  Physical Therapy Treatment  Patient Details  Name: Ricky FaceJames R Bonilla MRN: [id number] Date of Birth: 01/30/1951 Referring Provider: Linna CapriceSwinteck  Encounter Date: 10/19/2016      PT End of Session - 10/19/16 1342    Visit Number 2   Number of Visits 12   Date for PT Re-Evaluation 11/05/16   Authorization Type BCBS other    Authorization Time Period 10/15/16- 11/26/16   PT Start Time 1300   PT Stop Time 1339   PT Time Calculation (min) 39 min   Activity Tolerance Patient tolerated treatment well;No increased pain;Patient limited by fatigue   Behavior [facility] Therapy [facility] for tasks assessed/performed      Past Medical History:  Diagnosis Date  . Arthritis   . GERD (gastroesophageal reflux disease)   . Hypertension     Past Surgical History:  Procedure Laterality Date  . APPENDECTOMY    . COLONOSCOPY    . COLONOSCOPY N/A 08/07/2016   Procedure: COLONOSCOPY;  Surgeon: Franky MachoMark Jenkins, MD;  [facility];  Service: Gastroenterology;  Laterality: N/A;  . KNEE ARTHROPLASTY Right 09/27/2016   Procedure: RIGHT TOTAL KNEE ARTHROPLASTY WITH COMPUTER NAVIGATION;  Surgeon: Samson FredericBrian Swinteck, MD;  Location: [facility];  Service: Orthopedics;  Laterality: Right;  Combined spinal/epidural+regional block  . left rotator cuff repair     and right rotator cuff repair    There were no vitals filed for this visit.      Subjective Assessment - 10/19/16 1305    Subjective Pt reports things are going well. He has been performing his HEP with some soreness following this occasionally. No other complaints.    Pertinent History Essential tremor at baseline. Pt reports he underwent a Rt TKA on 09/27/16. Prior to that, pt reports about 12 months of functional decline.  Bilat rotator cuff repair >4 years ago with good return to funciton.    How long can you stand  comfortably? 30-45 minutes   How long can you walk comfortably? Pt has been walking household distances only since surgery.    Patient Stated Goals Return to PLOF.    Currently in Pain? No/denies   Pain Onset 1 to 4 weeks ago                         [facility] - 10/19/16 0001      Ambulation/Gait   Ambulation Distance (Feet) 550 Feet   Gait Comments visual/verbal cues for proper step length, heel initial contact without AD, no change with SPC     Knee/Hip Exercises: Standing   Other Standing Knee Exercises retro-rocking in // bars x5 min     Knee/Hip Exercises: Seated   Long Arc Quad Right;1 set;15 reps   Knee/Hip Flexion Rt knee flexion stretch 10x10" hold      Knee/Hip Exercises: Supine   Short Arc Quad Sets Right;1 set;15 reps;AROM   Heel Slides Right;AROM;1 set;20 reps   Heel Slides Limitations 5 sec hold    Bridges Both;1 set;15 reps     Manual Therapy   Manual Therapy Edema management;Soft tissue mobilization   Manual therapy comments separate rest of session   Edema Management RLE elevated, retrograde massage x5 min   Soft tissue mobilization STM Rt ITB/vastus lateralis                PT  Education - 10/19/16 1341    Education provided Yes   Education Details HEP review; continued mobility to improve pain and stiffness; importance of using an AD (RW/SPC) to improve gait mechanics for prevention of antalgic pattern; addition to HEP   Person(s) Educated Patient   Methods Explanation;Demonstration;Handout   Comprehension Verbalized understanding;Returned demonstration          PT Short Term Goals - 10/15/16 1552      PT SHORT TERM GOAL #1   Title After 3 weeks pt will demonstrate improved Rt knee flexion ROM on 10-114 degrees to improve mechnics during gait and stairs.    Status New     PT SHORT TERM GOAL #2   Title After 3 weeks patient will demonstrate improved functional strength as evidenced by 5xSTS hands free  in <13 seconds.    Status New     PT SHORT TERM GOAL #3   Title After 3 weeks patient will demonstrate improved functional gait AEB 221ft AMB <60 seconds to improve safety during community distances.    Status New           PT Long Term Goals - 10/15/16 1556      PT LONG TERM GOAL #1   Title After 6 weeks patient will demonstrate improved functional strength as evidenced by 5xSTS hands free in <10 seconds.    Status New     PT LONG TERM GOAL #2   Title After 6 weeks pt will demonstrate improved Rt knee flexion ROM on 5-122 degrees to improve mechnics during gait and stairs.    Status New     PT LONG TERM GOAL #3   Title After 6 weeks patient will demonstrate improved functional gait AEB 264ft AMB <50 seconds to improve safety during community distances.    Status New               Plan - 10/19/16 1343    Clinical Impression Statement Pt arrived today without an AD, having been performing his HEP at home consistently. Session spent reviewing HEP provided at initial eval, noting pt was able to perform with proper technique and set up. Discussed importance of using an AD to decrease antalgic gait mechanics, and provided verbal/visual feedback to improve step length and weight shift. Noting pt was able to demonstrate improved technique by the end of the activity, however this digressed by the end of the session once feedback was removed and he was exiting the clinic. Will continue to reinforce this in future sessions.   Rehab Potential Good   PT Frequency 2x / week   PT Duration 6 weeks   PT Treatment/Interventions ADLs/Self Care Home Management;Moist Heat;Therapeutic activities;Therapeutic exercise;Balance training;Dry needling;Energy conservation;Taping;Manual techniques;Manual lymph drainage;Passive range of motion;Patient/family education;Gait training;Stair training;Functional mobility training;DME Instruction   PT Next Visit Plan Repeat HEP, review and discuss home AMB  plan, gait training c RW, prone knee hang 3x60sec with 2x15 prone knee flexion A/ROM intermittently.    PT Home Exercise Plan At eval: AMB 2-3x daily 5 minutes, LAQ, SAQ, heel slides, Quad sets, bridging. addition of seated knee flexion self stretch 10x10 sec hold    Consulted and Agree with Plan of Care Patient      Patient will benefit from skilled therapeutic intervention in order to improve the following deficits and impairments:  Abnormal gait, Decreased range of motion, Difficulty walking, Increased fascial restricitons, Pain, Decreased activity tolerance, Decreased balance, Hypomobility, Decreased scar mobility, Decreased knowledge of use of DME, Decreased mobility,  Decreased strength, Increased edema, Improper body mechanics, Increased muscle spasms  Visit Diagnosis: Acute pain of right knee  Stiffness of right knee, not elsewhere classified  Difficulty in walking, not elsewhere classified     Problem List Patient Active Problem List   Diagnosis Date Noted  . Osteoarthritis of right knee 09/27/2016   1:58 PM,10/19/16 Marylyn Ishihara PT, DPT [facility] [phone]    [facility] [address] [place], [place], [address] Phone: [phone]   Fax:  [phone]  Name: Ricky Bonilla MRN: [id number] Date of Birth: 04-05-1951

## 2016-10-22 ENCOUNTER — Encounter (HOSPITAL_COMMUNITY): Payer: BLUE CROSS/BLUE SHIELD

## 2016-10-24 ENCOUNTER — Ambulatory Visit (HOSPITAL_COMMUNITY): Payer: BLUE CROSS/BLUE SHIELD

## 2016-10-24 DIAGNOSIS — M25561 Pain in right knee: Secondary | ICD-10-CM | POA: Diagnosis not present

## 2016-10-24 DIAGNOSIS — M25661 Stiffness of right knee, not elsewhere classified: Secondary | ICD-10-CM

## 2016-10-24 DIAGNOSIS — R262 Difficulty in walking, not elsewhere classified: Secondary | ICD-10-CM

## 2016-10-24 NOTE — Therapy (Signed)
[facility] [address] [place], [place], [address] Phone: [phone]   Fax:  [phone]  Physical Therapy Treatment  Patient Details  Name: Ricky Bonilla MRN: [id number] Date of Birth: [birth year] Referring Provider: Linna Caprice  Encounter Date: 10/24/2016      PT End of Session - 10/24/16 1314    Visit Number 3   Number of Visits 12   Date for PT Re-Evaluation 11/05/16   Authorization Type BCBS other    Authorization Time Period 10/15/16- 11/26/16   PT Start Time 1301   PT Stop Time 1347   PT Time Calculation (min) 46 min   Activity Tolerance Patient tolerated treatment well;No increased pain;Patient limited by fatigue   Behavior During Therapy [facility] for tasks assessed/performed      Past Medical History:  Diagnosis Date  . Arthritis   . GERD (gastroesophageal reflux disease)   . Hypertension     Past Surgical History:  Procedure Laterality Date  . APPENDECTOMY    . COLONOSCOPY    . COLONOSCOPY N/A 08/07/2016   Procedure: COLONOSCOPY;  Surgeon: Franky Macho, MD;  [facility];  Service: Gastroenterology;  Laterality: N/A;  . KNEE ARTHROPLASTY Right 09/27/2016   Procedure: RIGHT TOTAL KNEE ARTHROPLASTY WITH COMPUTER NAVIGATION;  Surgeon: Samson Frederic, MD;  [facility];  Service: Orthopedics;  Laterality: Right;  Combined spinal/epidural+regional block  . left rotator cuff repair     and right rotator cuff repair    There were no vitals filed for this visit.      Subjective Assessment - 10/24/16 1259    Subjective Pt stated knee is going well.  Current pain scale 4/10 mainly soreness and stiffness.  Reports compliance iwth HEP and gait training daily, has been waalking wihtout AD.     Patient Stated Goals Return to PLOF.    Currently in Pain? Yes   Pain Score 4    Pain Location Knee   Pain Orientation Right   Pain Descriptors / Indicators Sore;Aching  Stiffness   Pain Type Surgical pain   Pain Onset 1  to 4 weeks ago   Pain Frequency Intermittent   Aggravating Factors  Prolonged sitting, prolonged walking, HEP   Pain Relieving Factors Pain meds, activity modification                         [facility] Adult PT Treatment/Exercise - 10/24/16 0001      Ambulation/Gait   Ambulation Distance (Feet) 552 Feet   Assistive device Straight cane   Gait Pattern Antalgic  antalgic without AD, improved mechanics with SPC   Gait Comments visual/verbal cueing for equal stance phase and heel to toe, improved mechanics with SPC     Knee/Hip Exercises: Stretches   Active Hamstring Stretch 3 reps;30 seconds   Active Hamstring Stretch Limitations supine with rope     Knee/Hip Exercises: Supine   Quad Sets 10 reps;Right;AROM   Short Arc Quad Sets Right;1 set;15 reps;AROM   Heel Slides Right;AROM;1 set;20 reps   Heel Slides Limitations 5 sec hold    Knee Extension Limitations 12 degrees following manual and tactile/verbal cueing to improve quad contraction   Knee Flexion Limitations 116 degrees following manual     Knee/Hip Exercises: Prone   Hamstring Curl 10 reps   Prone Knee Hang 3 minutes   Prone Knee Hang Limitations AAROM with opposite LE     Manual Therapy   Manual Therapy Edema management;Soft  tissue mobilization   Manual therapy comments separate rest of session   Edema Management RLE elevated, retrograde massage x10 min                  PT Short Term Goals - 10/15/16 1552      PT SHORT TERM GOAL #1   Title After 3 weeks pt will demonstrate improved Rt knee flexion ROM on 10-114 degrees to improve mechnics during gait and stairs.    Status New     PT SHORT TERM GOAL #2   Title After 3 weeks patient will demonstrate improved functional strength as evidenced by 5xSTS hands free in <13 seconds.    Status New     PT SHORT TERM GOAL #3   Title After 3 weeks patient will demonstrate improved functional gait AEB 23ft AMB <60 seconds to improve safety during  community distances.    Status New           PT Long Term Goals - 10/15/16 1556      PT LONG TERM GOAL #1   Title After 6 weeks patient will demonstrate improved functional strength as evidenced by 5xSTS hands free in <10 seconds.    Status New     PT LONG TERM GOAL #2   Title After 6 weeks pt will demonstrate improved Rt knee flexion ROM on 5-122 degrees to improve mechnics during gait and stairs.    Status New     PT LONG TERM GOAL #3   Title After 6 weeks patient will demonstrate improved functional gait AEB 279ft AMB <50 seconds to improve safety during community distances.    Status New               Plan - 10/24/16 1349    Clinical Impression Statement Pt arrived without an AD, noted antalgic gait mechanics.  Pt instructed and educated on use of cane to improve gait mechanics and pain control.  Session focus on improving knee mobility.  Pt able to verbalize and demonstrate appropriate form with HEP.  Verbal and tactile cueing to improve quadricep contraction and reduce gluteal A.  Added stretches to improve muscle lengthening to assist with range and pain control.  Noted edema present, ended session with retro massage for edema control with improved knee mobilty (avoided incision as not fully healed), improved knee ROM to 12-116 degrees at EOS.     Rehab Potential Good   PT Frequency 2x / week   PT Duration 6 weeks   PT Treatment/Interventions ADLs/Self Care Home Management;Moist Heat;Therapeutic activities;Therapeutic exercise;Balance training;Dry needling;Energy conservation;Taping;Manual techniques;Manual lymph drainage;Passive range of motion;Patient/family education;Gait training;Stair training;Functional mobility training;DME Instruction   PT Next Visit Plan Continue with HEP.  Continue gait training with SPC to reduce antalgic gait mechanics, focus on quad strengthening and knee mobility to improve range.  Focus on extension.   PT Home Exercise Plan At eval: AMB  2-3x daily 5 minutes, LAQ, SAQ, heel slides, Quad sets, bridging. addition of seated knee flexion self stretch 10x10 sec hold       Patient will benefit from skilled therapeutic intervention in order to improve the following deficits and impairments:  Abnormal gait, Decreased range of motion, Difficulty walking, Increased fascial restricitons, Pain, Decreased activity tolerance, Decreased balance, Hypomobility, Decreased scar mobility, Decreased knowledge of use of DME, Decreased mobility, Decreased strength, Increased edema, Improper body mechanics, Increased muscle spasms  Visit Diagnosis: Acute pain of right knee  Stiffness of right knee, not elsewhere classified  Difficulty  in walking, not elsewhere classified     Problem List Patient Active Problem List   Diagnosis Date Noted  . Osteoarthritis of right knee 09/27/2016   Becky Saxasey Anthonia Monger, LPTA; CBIS [phone]  Juel BurrowCockerham, Weyman Bogdon Jo 10/24/2016, 2:08 PM  [place] [facility] [address] [place], [place], [address] Phone: [phone]   Fax:  [phone]  Name: Ricky Bonilla MRN: [id number] Date of Birth: 08/26/1951

## 2016-10-26 ENCOUNTER — Ambulatory Visit (HOSPITAL_COMMUNITY): Payer: BLUE CROSS/BLUE SHIELD

## 2016-10-26 ENCOUNTER — Encounter (HOSPITAL_COMMUNITY): Payer: Self-pay

## 2016-10-26 DIAGNOSIS — R262 Difficulty in walking, not elsewhere classified: Secondary | ICD-10-CM

## 2016-10-26 DIAGNOSIS — M25661 Stiffness of right knee, not elsewhere classified: Secondary | ICD-10-CM

## 2016-10-26 DIAGNOSIS — M25561 Pain in right knee: Secondary | ICD-10-CM

## 2016-10-26 NOTE — Therapy (Signed)
[facility] [address] , [place], [address] Phone: [phone]   Fax:  [phone]  Physical Therapy Treatment  Patient Details  Name: Ricky Bonilla MRN: [id number] Date of Birth: 06/03/1951 Referring Provider: Linna CapriceSwinteck  Encounter Date: 10/26/2016      PT End of Session - 10/26/16 1308    Visit Number 4   Number of Visits 12   Date for PT Re-Evaluation 11/05/16   Authorization Type BCBS other    Authorization Time Period 10/15/16- 11/26/16   PT Start Time 1301   PT Stop Time 1346   PT Time Calculation (min) 45 min   Activity Tolerance Patient tolerated treatment well;No increased pain;Patient limited by fatigue   Behavior [facility] Therapy [facility] for tasks assessed/performed      Past Medical History:  Diagnosis Date  . Arthritis   . GERD (gastroesophageal reflux disease)   . Hypertension     Past Surgical History:  Procedure Laterality Date  . APPENDECTOMY    . COLONOSCOPY    . COLONOSCOPY N/A 08/07/2016   Procedure: COLONOSCOPY;  Surgeon: Franky MachoMark Jenkins, MD;  [facility];  Service: Gastroenterology;  Laterality: N/A;  . KNEE ARTHROPLASTY Right 09/27/2016   Procedure: RIGHT TOTAL KNEE ARTHROPLASTY WITH COMPUTER NAVIGATION;  Surgeon: Samson FredericBrian Swinteck, MD;  Location: [facility];  Service: Orthopedics;  Laterality: Right;  Combined spinal/epidural+regional block  . left rotator cuff repair     and right rotator cuff repair    There were no vitals filed for this visit.      Subjective Assessment - 10/26/16 1301    Subjective Pt stated knee is feeling good today, current pain scale 3/10 more stiffness.  Reports compliance wiht HEP daily.  [facility]     Pertinent History Essential tremor at baseline. Pt reports he underwent a Rt TKA on 09/27/16. Prior to that, pt reports about 12 months of functional decline.  Bilat rotator cuff repair >4 years ago with good return to funciton.    Patient Stated  Goals Return to PLOF.    Currently in Pain? Yes   Pain Score 3    Pain Location Knee   Pain Orientation Right   Pain Descriptors / Indicators Aching;Sore  stiffness   Pain Type Surgical pain   Pain Onset 1 to 4 weeks ago   Pain Frequency Intermittent   Aggravating Factors  Prolonger sitting, prolonged wakling, HEP   Pain Relieving Factors Paid meds, activity modificaiton                         [facility] Adult PT Treatment/Exercise - 10/26/16 0001      Ambulation/Gait   Ambulation Distance (Feet) 226 Feet   Assistive device Straight cane   Gait Pattern Decreased stance time - right;Trunk flexed   Gait Comments visual/verbal cueing for equal stance phase and heel to toe, improved mechanics with SPC     Knee/Hip Exercises: Stretches   Active Hamstring Stretch 3 reps;30 seconds   Active Hamstring Stretch Limitations supine with rope   Quad Stretch 3 reps;30 seconds   Quad Stretch Limitations prone with rope   Gastroc Stretch 3 reps;30 seconds   Gastroc Stretch Limitations slant board     Knee/Hip Exercises: Supine   Quad Sets 15 reps;Right;AROM   Short Arc Quad Sets Right;1 set;15 reps;AROM   Heel Slides Right;AROM;1 set;20 reps   Heel Slides Limitations 5 sec hold  Bridges 15 reps   Knee Extension Limitations 10 degrees following manual and tactile/verbal cueing for correct mm contraction   Knee Flexion Limitations 118  degrees following manual      Knee/Hip Exercises: Prone   Prone Knee Hang 4 minutes   Prone Knee Hang Limitations AAROM with opposite LE     Manual Therapy   Manual Therapy Edema management;Soft tissue mobilization   Manual therapy comments separate rest of session   Edema Management RLE elevated, retrograde massage x10 min   Soft tissue mobilization STM to rectus femoris and vastus lateralis                  PT Short Term Goals - 10/15/16 1552      PT SHORT TERM GOAL #1   Title After 3 weeks pt will demonstrate improved Rt  knee flexion ROM on 10-114 degrees to improve mechnics during gait and stairs.    Status New     PT SHORT TERM GOAL #2   Title After 3 weeks patient will demonstrate improved functional strength as evidenced by 5xSTS hands free in <13 seconds.    Status New     PT SHORT TERM GOAL #3   Title After 3 weeks patient will demonstrate improved functional gait AEB 276ft AMB <60 seconds to improve safety during community distances.    Status New           PT Long Term Goals - 10/15/16 1556      PT LONG TERM GOAL #1   Title After 6 weeks patient will demonstrate improved functional strength as evidenced by 5xSTS hands free in <10 seconds.    Status New     PT LONG TERM GOAL #2   Title After 6 weeks pt will demonstrate improved Rt knee flexion ROM on 5-122 degrees to improve mechnics during gait and stairs.    Status New     PT LONG TERM GOAL #3   Title After 6 weeks patient will demonstrate improved functional gait AEB 267ft AMB <50 seconds to improve safety during community distances.    Status New               Plan - 10/26/16 1318    Clinical Impression Statement Pt arrived with SPC, noted vast improvements with gait mechanics, continues to require cueing to equalize stance phase and improve posture.  Session focus on improving knee mobility. Began session with manual retro massage and STM for edema control and to reduce overall tightness prior ROM based exercises.  Noted increased shacking LE musculature due to fatigue with increased reps with therex today.  Pt improving distal quad contractions with min verbal and tactile cueing to improve quad activation and reduce gluteal activation.  Improved range at EOS to 10-118 degrees with min verbal and tactile cueing.  No reports of increased pain through session.  Encouraged pt to apply ice at home for pain and edema control following session.     Rehab Potential Good   PT Frequency 2x / week   PT Duration 6 weeks   PT  Treatment/Interventions ADLs/Self Care Home Management;Moist Heat;Therapeutic activities;Therapeutic exercise;Balance training;Dry needling;Energy conservation;Taping;Manual techniques;Manual lymph drainage;Passive range of motion;Patient/family education;Gait training;Stair training;Functional mobility training;DME Instruction   PT Next Visit Plan Continue gait training with SPC to reduce antalgic gait mechanics, focus on quad strengthening and knee mobility to improve range.  Focus on extension!   PT Home Exercise Plan At eval: AMB 2-3x daily 5 minutes, LAQ, SAQ, heel  slides, Quad sets, bridging. addition of seated knee flexion self stretch 10x10 sec hold       Patient will benefit from skilled therapeutic intervention in order to improve the following deficits and impairments:  Abnormal gait, Decreased range of motion, Difficulty walking, Increased fascial restricitons, Pain, Decreased activity tolerance, Decreased balance, Hypomobility, Decreased scar mobility, Decreased knowledge of use of DME, Decreased mobility, Decreased strength, Increased edema, Improper body mechanics, Increased muscle spasms  Visit Diagnosis: Acute pain of right knee  Stiffness of right knee, not elsewhere classified  Difficulty in walking, not elsewhere classified     Problem List Patient Active Problem List   Diagnosis Date Noted  . Osteoarthritis of right knee 09/27/2016   Becky Sax, LPTA; CBIS [phone]  Juel Burrow 10/26/2016, 1:43 PM  [facility] [address] [place], [place], [address] Phone: [phone]   Fax:  [phone]  Name: Ricky Bonilla MRN: [id number] Date of Birth: April 02, 1951

## 2016-10-29 ENCOUNTER — Encounter (HOSPITAL_COMMUNITY): Payer: BLUE CROSS/BLUE SHIELD

## 2016-10-31 ENCOUNTER — Ambulatory Visit (HOSPITAL_COMMUNITY): Payer: BLUE CROSS/BLUE SHIELD

## 2016-10-31 DIAGNOSIS — M25661 Stiffness of right knee, not elsewhere classified: Secondary | ICD-10-CM

## 2016-10-31 DIAGNOSIS — M25561 Pain in right knee: Secondary | ICD-10-CM | POA: Diagnosis not present

## 2016-10-31 DIAGNOSIS — R262 Difficulty in walking, not elsewhere classified: Secondary | ICD-10-CM

## 2016-10-31 NOTE — Therapy (Signed)
[facility] [address] [place], [place], [address] Phone: [phone]   Fax:  [phone]  Physical Therapy Treatment  Patient Details  Name: Ricky Bonilla MRN: [id number] Date of Birth: 09/01/1951 Referring Provider: Linna CapriceSwinteck  Encounter Date: 10/31/2016      PT End of Session - 10/31/16 1308    Visit Number 5   Number of Visits 12   Date for PT Re-Evaluation 11/05/16   Authorization Type BCBS other    Authorization Time Period 10/15/16- 11/26/16   PT Start Time 1300   PT Stop Time 1343   PT Time Calculation (min) 43 min   Activity Tolerance Patient tolerated treatment well;No increased pain   Behavior During Therapy WFL for tasks assessed/performed      Past Medical History:  Diagnosis Date  . Arthritis   . GERD (gastroesophageal reflux disease)   . Hypertension     Past Surgical History:  Procedure Laterality Date  . APPENDECTOMY    . COLONOSCOPY    . COLONOSCOPY N/A 08/07/2016   Procedure: COLONOSCOPY;  Surgeon: Franky MachoMark Jenkins, MD;  [facility];  Service: Gastroenterology;  Laterality: N/A;  . KNEE ARTHROPLASTY Right 09/27/2016   Procedure: RIGHT TOTAL KNEE ARTHROPLASTY WITH COMPUTER NAVIGATION;  Surgeon: Samson FredericBrian Swinteck, MD;  [facility];  Service: Orthopedics;  Laterality: Right;  Combined spinal/epidural+regional block  . left rotator cuff repair     and right rotator cuff repair    There were no vitals filed for this visit.      Subjective Assessment - 10/31/16 1253    Subjective Pt stated knee is feeling good today, current pain scale 4/10 pulsing pain and stiffness.  [facility]     Pertinent History Essential tremor at baseline. Pt reports he underwent a Rt TKA on 09/27/16. Prior to that, pt reports about 12 months of functional decline.  Bilat rotator cuff repair >4 years ago with good return to funciton.    Patient Stated Goals Return to PLOF.    Currently in Pain? Yes    Pain Score 4    Pain Location Knee   Pain Orientation Right   Pain Descriptors / Indicators Tightness  pulsing and stiffness   Pain Type Surgical pain   Pain Onset 1 to 4 weeks ago   Pain Frequency Intermittent   Aggravating Factors  Prolonged sitting, prolonged walking, HEP   Pain Relieving Factors pain meds, activitiy modification                         [facility] Adult PT Treatment/Exercise - 10/31/16 0001      Ambulation/Gait   Ambulation Distance (Feet) 552 Feet   Assistive device Straight cane   Gait Pattern Decreased stance time - right;Trunk flexed   Gait Comments cueing for heel strike and appropriate HHA     Knee/Hip Exercises: Stretches   Active Hamstring Stretch 3 reps;30 seconds   Active Hamstring Stretch Limitations supine with rope   Quad Stretch 3 reps;30 seconds   Quad Stretch Limitations prone with rope   Gastroc Stretch 3 reps;30 seconds   Gastroc Stretch Limitations slant board     Knee/Hip Exercises: Standing   Heel Raises 15 reps   Heel Raises Limitations heel and toe raises with incline slope   Terminal Knee Extension Limitations TKE 10x 10" with RTB     Knee/Hip Exercises: Seated   Long Arc Quad Right;1 set;15 reps  Knee/Hip Exercises: Supine   Quad Sets 15 reps;Right;AROM   Short Arc Quad Sets Right;1 set;15 reps;AROM   Heel Slides Right;AROM;1 set;20 reps   Heel Slides Limitations 5 sec hold    Knee Extension Limitations 8 degrees following manual    Knee Flexion Limitations 118  degrees following manual      Knee/Hip Exercises: Prone   Hip Extension Limitations TKE in prone 15x 10" holds     Manual Therapy   Manual Therapy Edema management;Soft tissue mobilization;Joint mobilization   Manual therapy comments separate rest of session   Edema Management RLE elevated, retrograde massage x10 min   Joint Mobilization patella mobility all directions and fib/tib    Soft tissue mobilization STM to rectus femoris and vastus  lateralis                  PT Short Term Goals - 10/15/16 1552      PT SHORT TERM GOAL #1   Title After 3 weeks pt will demonstrate improved Rt knee flexion ROM on 10-114 degrees to improve mechnics during gait and stairs.    Status New     PT SHORT TERM GOAL #2   Title After 3 weeks patient will demonstrate improved functional strength as evidenced by 5xSTS hands free in <13 seconds.    Status New     PT SHORT TERM GOAL #3   Title After 3 weeks patient will demonstrate improved functional gait AEB 257ft AMB <60 seconds to improve safety during community distances.    Status New           PT Long Term Goals - 10/15/16 1556      PT LONG TERM GOAL #1   Title After 6 weeks patient will demonstrate improved functional strength as evidenced by 5xSTS hands free in <10 seconds.    Status New     PT LONG TERM GOAL #2   Title After 6 weeks pt will demonstrate improved Rt knee flexion ROM on 5-122 degrees to improve mechnics during gait and stairs.    Status New     PT LONG TERM GOAL #3   Title After 6 weeks patient will demonstrate improved functional gait AEB 214ft AMB <50 seconds to improve safety during community distances.    Status New               Plan - 10/31/16 1314    Clinical Impression Statement Began session instructing proper hand use and importance of proper gait mechanics (heel strike) to improve knee mobility and normalize mechanics. Pt presents this session with decreased edema to Rt knee.  Session focus on improving knee extension with gait training, therex for quad strengthening and hamstring/gastroc flexibility and manual technqiues to assist with edema control and to reduce tightness.  Added prone and standing TKE for quad strengthening.  EOS pt reports improved knee mobilty and pain reduced with improved gait mechanics.  Improved AROM 8-118 degrees following manual and therex this session.     Rehab Potential Good   PT Frequency 2x / week   PT  Duration 6 weeks   PT Treatment/Interventions ADLs/Self Care Home Management;Moist Heat;Therapeutic activities;Therapeutic exercise;Balance training;Dry needling;Energy conservation;Taping;Manual techniques;Manual lymph drainage;Passive range of motion;Patient/family education;Gait training;Stair training;Functional mobility training;DME Instruction   PT Next Visit Plan Continue gait training with SPC to reduce antalgic gait mechanics, focus on quad strengthening and knee mobility to improve range.  Focus on extension!   PT Home Exercise Plan At eval: AMB 2-3x daily  5 minutes, LAQ, SAQ, heel slides, Quad sets, bridging. addition of seated knee flexion self stretch 10x10 sec hold       Patient will benefit from skilled therapeutic intervention in order to improve the following deficits and impairments:  Abnormal gait, Decreased range of motion, Difficulty walking, Increased fascial restricitons, Pain, Decreased activity tolerance, Decreased balance, Hypomobility, Decreased scar mobility, Decreased knowledge of use of DME, Decreased mobility, Decreased strength, Increased edema, Improper body mechanics, Increased muscle spasms  Visit Diagnosis: Acute pain of right knee  Stiffness of right knee, not elsewhere classified  Difficulty in walking, not elsewhere classified     Problem List Patient Active Problem List   Diagnosis Date Noted  . Osteoarthritis of right knee 09/27/2016   Becky Sax, LPTA; CBIS [phone]  Ricky Bonilla 10/31/2016, 1:50 PM  [facility] [address] [place], [place], [address] Phone: [phone]   Fax:  [phone]  Name: Ricky Bonilla MRN: [id number] Date of Birth: 09-24-1951

## 2016-11-02 ENCOUNTER — Ambulatory Visit (HOSPITAL_COMMUNITY): Payer: BLUE CROSS/BLUE SHIELD | Attending: Orthopedic Surgery | Admitting: Physical Therapy

## 2016-11-02 DIAGNOSIS — R262 Difficulty in walking, not elsewhere classified: Secondary | ICD-10-CM | POA: Diagnosis present

## 2016-11-02 DIAGNOSIS — M25561 Pain in right knee: Secondary | ICD-10-CM | POA: Diagnosis not present

## 2016-11-02 DIAGNOSIS — M25661 Stiffness of right knee, not elsewhere classified: Secondary | ICD-10-CM | POA: Diagnosis present

## 2016-11-02 NOTE — Therapy (Signed)
[facility] [address] [place], [place], [address] Phone: [phone]   Fax:  [phone]  Physical Therapy Treatment  Patient Details  Name: Ricky Bonilla MRN: [id number] Date of Birth: 03/08/1951 Referring Provider: Linna CapriceSwinteck  Encounter Date: 11/02/2016      PT End of Session - 11/02/16 1340    Visit Number 6   Number of Visits 12   Date for PT Re-Evaluation 11/05/16   Authorization Type BCBS other    Authorization Time Period 10/15/16- 11/26/16   PT Start Time 1301   PT Stop Time 1340   PT Time Calculation (min) 39 min   Equipment Utilized During Treatment Back brace   Activity Tolerance Patient tolerated treatment well;No increased pain   Behavior During Therapy WFL for tasks assessed/performed      Past Medical History:  Diagnosis Date  . Arthritis   . GERD (gastroesophageal reflux disease)   . Hypertension     Past Surgical History:  Procedure Laterality Date  . APPENDECTOMY    . COLONOSCOPY    . COLONOSCOPY N/A 08/07/2016   Procedure: COLONOSCOPY;  Surgeon: Franky MachoMark Jenkins, MD;  [facility];  Service: Gastroenterology;  Laterality: N/A;  . KNEE ARTHROPLASTY Right 09/27/2016   Procedure: RIGHT TOTAL KNEE ARTHROPLASTY WITH COMPUTER NAVIGATION;  Surgeon: Samson FredericBrian Swinteck, MD;  [facility];  Service: Orthopedics;  Laterality: Right;  Combined spinal/epidural+regional block  . left rotator cuff repair     and right rotator cuff repair    There were no vitals filed for this visit.      Subjective Assessment - 11/02/16 1304    Subjective Pt reports that things are going well. He continues to perform his HEP. Notes some swelling and stiffness however he is only trying to take his medication periodically at this time.    Pertinent History Essential tremor at baseline. Pt reports he underwent a Rt TKA on 09/27/16. Prior to that, pt reports about 12 months of functional decline.  Bilat rotator cuff repair >4 years  ago with good return to funciton.    Patient Stated Goals Return to PLOF.    Currently in Pain? Yes   Pain Score 3    Pain Location Knee   Pain Orientation Right   Pain Descriptors / Indicators Tightness   Pain Type Surgical pain   Pain Onset 1 to 4 weeks ago   Pain Frequency Intermittent            [facility] PT Assessment - 11/02/16 0001      PROM   Overall PROM  --  0-10-110 deg                      [facility] Adult PT Treatment/Exercise - 11/02/16 0001      Knee/Hip Exercises: Stretches   Passive Hamstring Stretch Right;3 reps;30 seconds   Passive Hamstring Stretch Limitations pt providing overpressure to Rt knee stretch    Gastroc Stretch Right;3 reps;30 seconds   Gastroc Stretch Limitations slantboard      Knee/Hip Exercises: Standing   Rocker Board 2 minutes   Rocker Board Limitations A/P, Lt/Rt   SLS 2x20 sec on RLE only, firm and foam surface    Other Standing Knee Exercises TKE against mat table and towel roll, 3sec hold x15 reps    Other Standing Knee Exercises Retro-rocking in // bars x2 min     Knee/Hip Exercises: Seated   Sit to Sand 2 sets;10 reps;without UE  support;Other (comment)  LLE forward to improved weight shift Rt      Manual Therapy   Manual Therapy Joint mobilization   Manual therapy comments separate rest of session   Joint Mobilization Rt proximal fibular mobs; Closed chain medial femoral rotation mobs, RLE                PT Education - 11/02/16 1340    Education provided Yes   Education Details discussed importance of using proper technique with sit to stand and encouraged pt to work on this at home.    Person(s) Educated Patient   Methods Explanation;Verbal cues   Comprehension Verbalized understanding          PT Short Term Goals - 10/15/16 1552      PT SHORT TERM GOAL #1   Title After 3 weeks pt will demonstrate improved Rt knee flexion ROM on 10-114 degrees to improve mechnics during gait and stairs.    Status  New     PT SHORT TERM GOAL #2   Title After 3 weeks patient will demonstrate improved functional strength as evidenced by 5xSTS hands free in <13 seconds.    Status New     PT SHORT TERM GOAL #3   Title After 3 weeks patient will demonstrate improved functional gait AEB 263ft AMB <60 seconds to improve safety during community distances.    Status New           PT Long Term Goals - 10/15/16 1556      PT LONG TERM GOAL #1   Title After 6 weeks patient will demonstrate improved functional strength as evidenced by 5xSTS hands free in <10 seconds.    Status New     PT LONG TERM GOAL #2   Title After 6 weeks pt will demonstrate improved Rt knee flexion ROM on 5-122 degrees to improve mechnics during gait and stairs.    Status New     PT LONG TERM GOAL #3   Title After 6 weeks patient will demonstrate improved functional gait AEB 250ft AMB <50 seconds to improve safety during community distances.    Status New               Plan - 11/02/16 1348    Clinical Impression Statement Continued this session with manual techniques to address knee extension ROM deficits followed by closed chain strengthening activity. Noting pt continues to place increased weight on his LLE during sit to stand activity, so this was addressed with verbal cues and adjustments during a sit to stand activity. Also encouraged the pt to increase awareness of this at home. Ended with activity to improve confidence with weight shift to the Rt during ambulation and other activity. Pt reporting overall improved stiffness and mobility following today's session. Will continue with current POC.    Rehab Potential Good   PT Frequency 2x / week   PT Duration 6 weeks   PT Treatment/Interventions ADLs/Self Care Home Management;Moist Heat;Therapeutic activities;Therapeutic exercise;Balance training;Dry needling;Energy conservation;Taping;Manual techniques;Manual lymph drainage;Passive range of motion;Patient/family  education;Gait training;Stair training;Functional mobility training;DME Instruction   PT Next Visit Plan knee extension ROM; closed chain strengthening and balance activity. Weight shifting to improve mechanics with ambulation; gait training with SPC and decrease to no AD as able    PT Home Exercise Plan At eval: AMB 2-3x daily 5 minutes, LAQ, SAQ, heel slides, Quad sets, bridging. addition of seated knee flexion self stretch 10x10 sec hold    Consulted and Agree with  Plan of Care Patient      Patient will benefit from skilled therapeutic intervention in order to improve the following deficits and impairments:  Abnormal gait, Decreased range of motion, Difficulty walking, Increased fascial restricitons, Pain, Decreased activity tolerance, Decreased balance, Hypomobility, Decreased scar mobility, Decreased knowledge of use of DME, Decreased mobility, Decreased strength, Increased edema, Improper body mechanics, Increased muscle spasms  Visit Diagnosis: Acute pain of right knee  Stiffness of right knee, not elsewhere classified  Difficulty in walking, not elsewhere classified     Problem List Patient Active Problem List   Diagnosis Date Noted  . Osteoarthritis of right knee 09/27/2016    1:57 PM,11/02/16 Ricky Bonilla PT, DPT [facility] [phone]   [facility] [address] [place], [place], [address] Phone: [phone]   Fax:  [phone]  Name: Ricky Bonilla MRN: [id number] Date of Birth: Mar 23, 1951

## 2016-11-05 ENCOUNTER — Ambulatory Visit (HOSPITAL_COMMUNITY): Payer: BLUE CROSS/BLUE SHIELD | Admitting: Physical Therapy

## 2016-11-05 DIAGNOSIS — R262 Difficulty in walking, not elsewhere classified: Secondary | ICD-10-CM

## 2016-11-05 DIAGNOSIS — M25561 Pain in right knee: Secondary | ICD-10-CM | POA: Diagnosis not present

## 2016-11-05 DIAGNOSIS — M25661 Stiffness of right knee, not elsewhere classified: Secondary | ICD-10-CM

## 2016-11-05 NOTE — Therapy (Signed)
[facility] [address] [place], [place], [address] Phone: [phone]   Fax:  [phone]  Physical Therapy Treatment  Patient Details  Name: EBB CARELOCK MRN: [id number] Date of Birth: 06-03-1951 Referring Provider: Linna Caprice  Encounter Date: 11/05/2016      PT End of Session - 11/05/16 1750    Visit Number 7   Number of Visits 12   Date for PT Re-Evaluation 11/05/16   Authorization Type BCBS other    Authorization Time Period 10/15/16- 11/26/16   PT Start Time 1430   PT Stop Time 1515   PT Time Calculation (min) 45 min   Equipment Utilized During Treatment Back brace   Activity Tolerance Patient tolerated treatment well;No increased pain   Behavior During Therapy WFL for tasks assessed/performed      Past Medical History:  Diagnosis Date  . Arthritis   . GERD (gastroesophageal reflux disease)   . Hypertension     Past Surgical History:  Procedure Laterality Date  . APPENDECTOMY    . COLONOSCOPY    . COLONOSCOPY N/A 08/07/2016   Procedure: COLONOSCOPY;  Surgeon: Franky Macho, MD;  [facility];  Service: Gastroenterology;  Laterality: N/A;  . KNEE ARTHROPLASTY Right 09/27/2016   Procedure: RIGHT TOTAL KNEE ARTHROPLASTY WITH COMPUTER NAVIGATION;  Surgeon: Samson Frederic, MD;  [facility]: [facility];  Service: Orthopedics;  Laterality: Right;  Combined spinal/epidural+regional block  . left rotator cuff repair     and right rotator cuff repair    There were no vitals filed for this visit.      Subjective Assessment - 11/05/16 1748    Subjective Pt reports no pain, doing his HEP with less swelling.  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   Currently in Pain? No/denies                         [facility] Adult PT Treatment/Exercise - 11/05/16 0001      Ambulation/Gait   Ambulation Distance (Feet) 100 Feet   Assistive device None     Knee/Hip  Exercises: Stretches   Active Hamstring Stretch 5 reps;20 seconds   Active Hamstring Stretch Limitations 12" step   Knee: Self-Stretch to increase Flexion 10 seconds  10 reps on12" step   Knee: Self-Stretch Limitations 12" box 10 reps   Gastroc Stretch Right;3 reps;30 seconds   Gastroc Stretch Limitations slantboard      Knee/Hip Exercises: Standing   Heel Raises 15 reps   Heel Raises Limitations heel and toe raises with incline slope   Forward Lunges Both;10 reps   Forward Lunges Limitations no UE onto 4" box   Rocker Board 2 minutes   Rocker Board Limitations A/P, Lt/Rt   SLS max of 11" Rt without UE asssit     Knee/Hip Exercises: Supine   Quad Sets 15 reps;Right;AROM   Short Arc Quad Sets Right;1 set;15 reps;AROM   Heel Slides Right;AROM;1 set;20 reps   Heel Slides Limitations 5 sec hold    Knee Extension Limitations 5 degrees following manual    Knee Flexion Limitations 115 degrees following manual      Manual Therapy   Manual Therapy Joint mobilization;Myofascial release   Manual therapy comments separate rest of session   Joint Mobilization Rt proximal fibular mobs; Closed chain medial femoral rotation mobs, RLE   Myofascial Release to tightness/adhesions mostly lateral knee  and over scar.                  PT Short Term Goals - 10/15/16 1552      PT SHORT TERM GOAL #1   Title After 3 weeks pt will demonstrate improved Rt knee flexion ROM on 10-114 degrees to improve mechnics during gait and stairs.    Status New     PT SHORT TERM GOAL #2   Title After 3 weeks patient will demonstrate improved functional strength as evidenced by 5xSTS hands free in <13 seconds.    Status New     PT SHORT TERM GOAL #3   Title After 3 weeks patient will demonstrate improved functional gait AEB 290ft AMB <60 seconds to improve safety during community distances.    Status New           PT Long Term Goals - 10/15/16 1556      PT LONG TERM GOAL #1   Title After 6 weeks  patient will demonstrate improved functional strength as evidenced by 5xSTS hands free in <10 seconds.    Status New     PT LONG TERM GOAL #2   Title After 6 weeks pt will demonstrate improved Rt knee flexion ROM on 5-122 degrees to improve mechnics during gait and stairs.    Status New     PT LONG TERM GOAL #3   Title After 6 weeks patient will demonstrate improved functional gait AEB 218ft AMB <50 seconds to improve safety during community distances.    Status New               Plan - 11/05/16 1750    Clinical Impression Statement continued with ROM focus this sesssion as well as stability in Lt knee.  Overall improving with ability to SLS for 11 seconds on Rt LE without UE assist.  Pt requires max postural cues with standing actvitiy.  Able to work on gait without SPC at end of session with noted improvment and only slight antalgia.  Encouraged to slow walking speed to concentrate on heel toe gait and bending knee.  Pt with adhesions mostly lateral aspect of knee, able to resolve mostly with myofascial techniqes.  Improved extension this session to 5 with measured ROM 5-115 at end of session.  To discuss with evaluating PT possiblity of progressing functional strengtheing to prepare and ready for RTW at this point.     Rehab Potential Good   PT Frequency 2x / week   PT Duration 6 weeks   PT Treatment/Interventions ADLs/Self Care Home Management;Moist Heat;Therapeutic activities;Therapeutic exercise;Balance training;Dry needling;Energy conservation;Taping;Manual techniques;Manual lymph drainage;Passive range of motion;Patient/family education;Gait training;Stair training;Functional mobility training;DME Instruction   PT Next Visit Plan knee extension ROM; closed chain strengthening and balance activity. gait training with SPC and decrease to no AD as able. Begin functional stengthening if evaluating therapist approves (lunges, squats, step ups/downs, etc)   PT Home Exercise Plan At eval:  AMB 2-3x daily 5 minutes, LAQ, SAQ, heel slides, Quad sets, bridging. addition of seated knee flexion self stretch 10x10 sec hold    Consulted and Agree with Plan of Care Patient      Patient will benefit from skilled therapeutic intervention in order to improve the following deficits and impairments:  Abnormal gait, Decreased range of motion, Difficulty walking, Increased fascial restricitons, Pain, Decreased activity tolerance, Decreased balance, Hypomobility, Decreased scar mobility, Decreased knowledge of use of DME, Decreased mobility, Decreased strength, Increased edema, Improper body mechanics, Increased muscle  spasms  Visit Diagnosis: Acute pain of right knee  Stiffness of right knee, not elsewhere classified  Difficulty in walking, not elsewhere classified     Problem List Patient Active Problem List   Diagnosis Date Noted  . Osteoarthritis of right knee 09/27/2016    Lurena Nidamy B Lucila Klecka, PTA/CLT [phone]  11/05/2016, 6:00 PM  [facility] [address] [place], [place], [address] Phone: [phone]   Fax:  [phone]  Name: Lonell FaceJames R Vidaurri MRN: [id number] Date of Birth: 08/19/1951

## 2016-11-07 ENCOUNTER — Ambulatory Visit (HOSPITAL_COMMUNITY): Payer: BLUE CROSS/BLUE SHIELD

## 2016-11-07 DIAGNOSIS — M25561 Pain in right knee: Secondary | ICD-10-CM

## 2016-11-07 DIAGNOSIS — R262 Difficulty in walking, not elsewhere classified: Secondary | ICD-10-CM

## 2016-11-07 DIAGNOSIS — M25661 Stiffness of right knee, not elsewhere classified: Secondary | ICD-10-CM

## 2016-11-07 NOTE — Therapy (Signed)
[place], [place], [address] Phone: [phone]   Fax:  [phone]  Physical Therapy Treatment  Patient Details  Name: Ricky Bonilla MRN: [id number] Date of Birth: April 27, 1951 Referring Provider: Hollie Salk  Encounter Date: 11/07/2016      PT End of Session - 11/07/16 1544    Visit Number 8   Number of Visits 12   Date for PT Re-Evaluation 11/26/16   Authorization Type BCBS other    Authorization Time Period 10/15/16- 11/26/16   PT Start Time 1436   PT Stop Time 1527   PT Time Calculation (min) 51 min   Activity Tolerance Patient tolerated treatment well;No increased pain   Behavior During Therapy WFL for tasks assessed/performed      Past Medical History:  Diagnosis Date  . Arthritis   . GERD (gastroesophageal reflux disease)   . Hypertension     Past Surgical History:  Procedure Laterality Date  . APPENDECTOMY    . COLONOSCOPY    . COLONOSCOPY N/A 08/07/2016   Procedure: COLONOSCOPY;  Surgeon: Aviva Signs, MD;  [facility];  Service: Gastroenterology;  Laterality: N/A;  . KNEE ARTHROPLASTY Right 09/27/2016   Procedure: RIGHT TOTAL KNEE ARTHROPLASTY WITH COMPUTER NAVIGATION;  Surgeon: Rod Can, MD;  [facility];  Service: Orthopedics;  Laterality: Right;  Combined spinal/epidural+regional block  . left rotator cuff repair     and right rotator cuff repair    There were no vitals filed for this visit.          [facility] PT Assessment - 11/07/16 0001      Assessment   Medical Diagnosis Rt TKA    Referring Provider Rod Can   Onset Date/Surgical Date 09/27/16   Hand Dominance Right   Next MD Visit First week of feb   Prior Therapy HHPT only, now DC.      Balance Screen   Has the patient fallen in the past 6 months --  No falls snce starting PT     Observation/Other Assessments-Edema    Edema --  continued postoperative swelling     PROM   Overall PROM   Deficits  11-111 degrees 2/7; 16-106 degrees at eval     Transfers   Five time sit to stand comments  5xSTS: 14.10s hands free     Ambulation/Gait   Ambulation Distance (Feet) 675 Feet   Assistive device None   Gait Pattern Step-through pattern  flexed knee   Gait velocity 1.44ms  0.822m at evaluation (+37%)                      [facility] Adult PT Treatment/Exercise - 11/07/16 0001      Knee/Hip Exercises: Seated   Sit to Sand without UE support;1 set  5x: 14.10s     Knee/Hip Exercises: Supine   Short Arc Quad Sets Right;4 sets  1x15, 0lb; 1x15 2lb; 2x15, 4lb.    Short ArDance movement psychotherapistnder thigh; pt reports less pain after weight added   BrGoogleets  2x8 straight leg bridge, heels elevate 12"    Knee Extension Limitations 11 degrees (20* at start of session)   16 degrees at eval    Knee Flexion Limitations 111 degrees  106* at eval     Knee/Hip Exercises: Prone   Prone Knee Hang 4 minutes  4x60sec alternating 4x15 knee flexion    Prone Knee Hang Weights (lbs) 0  Prone Knee Hang Limitations HEP addition                PT Education - 11/07/16 1543    Education provided Yes   Education Details updates to HEP and sustained stretches   Person(s) Educated Patient   Methods Explanation;Demonstration   Comprehension Verbalized understanding;Returned demonstration          PT Short Term Goals - 11/07/16 1552      PT SHORT TERM GOAL #1   Title After 3 weeks pt will demonstrate improved Rt knee flexion ROM on 10-114 degrees to improve mechnics during gait and stairs.    Baseline ROM still limited, but very close to goal: 11-111 degrees Rt knee flexion on 11/07/16   Status Achieved     PT SHORT TERM GOAL #2   Title After 3 weeks patient will demonstrate improved functional strength as evidenced by 5xSTS hands free in <13 seconds.    Baseline Hands free in 14.10s on 2/7   Status On-going     PT SHORT TERM GOAL #3    Title After 3 weeks patient will demonstrate improved functional gait AEB 232f AMB <60 seconds to improve safety during community distances.    Baseline Performed 3x consecutively during 3MWT on 2/7    Status Achieved           PT Long Term Goals - 11/07/16 1554      PT LONG TERM GOAL #1   Title After 6 weeks patient will demonstrate improved functional strength as evidenced by 5xSTS hands free in <10 seconds.    Status On-going     PT LONG TERM GOAL #2   Title After 6 weeks pt will demonstrate improved Rt knee flexion ROM on 5-122 degrees to improve mechnics during gait and stairs.    Status On-going     PT LONG TERM GOAL #3   Title After 6 weeks patient will demonstrate improved functional gait AEB 2246fAMB <50 seconds to improve safety during community distances.    Status On-going               Plan - 11/07/16 1544    Clinical Impression Statement Reassessment performed today Pt demonstrating large improvement in sustained gait speed, tolerance, and stability. Moderate improvement in strength or RLE. Mild improvements noted in Rt knee ROM, Rt knee edema/effusion, and power AEB 5xSTS. All short term goals have been met and progress towardlong term goals is noted. POC going forward will focus more heavily on targeted ROM work, and isAdministrator, Civil ServiceHEP has been updated this session.    Rehab Potential Good   PT Frequency 2x / week   PT Duration 6 weeks   PT Treatment/Interventions ADLs/Self Care Home Management;Moist Heat;Therapeutic activities;Therapeutic exercise;Balance training;Dry needling;Energy conservation;Taping;Manual techniques;Manual lymph drainage;Passive range of motion;Patient/family education;Gait training;Stair training;Functional mobility training;DME Instruction   PT Next Visit Plan Review recent additions to HEP at reassessment in full. Add in squats and basic balance actiivty. Lateral/Fwd/Bwd steps to begin after 2/19. Progress  weights for LAQ, SAQ, knee flexion as appropriate.    PT Home Exercise Plan Updated from eval: AMB 2x daily 10-minutes, LAQ 1x15x3sH, SAQ, heel slides ad lib, bridging updated to straight leg only; 11/07/16 added prone knee hang, long sitting stretch c weight, prone knee flexion.    Consulted and Agree with Plan of Care Patient      Patient will benefit from skilled therapeutic intervention in order to improve the following deficits and impairments:  Abnormal gait, Decreased range of motion, Difficulty walking, Increased fascial restricitons, Pain, Decreased activity tolerance, Decreased balance, Hypomobility, Decreased scar mobility, Decreased knowledge of use of DME, Decreased mobility, Decreased strength, Increased edema, Improper body mechanics, Increased muscle spasms  Visit Diagnosis: Acute pain of right knee  Stiffness of right knee, not elsewhere classified  Difficulty in walking, not elsewhere classified     Problem List Patient Active Problem List   Diagnosis Date Noted  . Osteoarthritis of right knee 09/27/2016    3:57 PM, 11/07/16 Etta Grandchild, PT, DPT Physical Therapist at [place] [phone] (office)     [place] [address] [place], [place], [address] Phone: [phone]   Fax:  [phone]  Name: Ricky Bonilla MRN: [id number] Date of Birth: 1951-04-07

## 2016-11-12 ENCOUNTER — Ambulatory Visit (HOSPITAL_COMMUNITY): Payer: BLUE CROSS/BLUE SHIELD

## 2016-11-14 ENCOUNTER — Ambulatory Visit (HOSPITAL_COMMUNITY): Payer: BLUE CROSS/BLUE SHIELD | Admitting: Physical Therapy

## 2016-11-14 ENCOUNTER — Encounter (HOSPITAL_COMMUNITY): Payer: Self-pay | Admitting: Physical Therapy

## 2016-11-14 DIAGNOSIS — M25561 Pain in right knee: Secondary | ICD-10-CM

## 2016-11-14 DIAGNOSIS — R262 Difficulty in walking, not elsewhere classified: Secondary | ICD-10-CM

## 2016-11-14 DIAGNOSIS — M25661 Stiffness of right knee, not elsewhere classified: Secondary | ICD-10-CM

## 2016-11-14 NOTE — Therapy (Signed)
[facility] [address] [place], [place], [address] Phone: [phone]   Fax:  [phone]  Physical Therapy Treatment  Patient Details  Name: Ricky Bonilla MRN: [id number] Date of Birth: 08/17/1951 Referring Provider: Tacy LearnBrian Swintck  Encounter Date: 11/14/2016      PT End of Session - 11/14/16 1436    Visit Number 9   Number of Visits 12   Date for PT Re-Evaluation 11/26/16   Authorization Type BCBS other    Authorization Time Period 10/15/16- 11/26/16   PT Start Time 1431   PT Stop Time 1509   PT Time Calculation (min) 38 min   Activity Tolerance Patient tolerated treatment well;No increased pain   Behavior During Therapy WFL for tasks assessed/performed      Past Medical History:  Diagnosis Date  . Arthritis   . GERD (gastroesophageal reflux disease)   . Hypertension     Past Surgical History:  Procedure Laterality Date  . APPENDECTOMY    . COLONOSCOPY    . COLONOSCOPY N/A 08/07/2016   Procedure: COLONOSCOPY;  Surgeon: Franky MachoMark Jenkins, MD;  [facility];  Service: Gastroenterology;  Laterality: N/A;  . KNEE ARTHROPLASTY Right 09/27/2016   Procedure: RIGHT TOTAL KNEE ARTHROPLASTY WITH COMPUTER NAVIGATION;  Surgeon: Samson FredericBrian Swinteck, MD;  [facility];  Service: Orthopedics;  Laterality: Right;  Combined spinal/epidural+regional block  . left rotator cuff repair     and right rotator cuff repair    There were no vitals filed for this visit.      Subjective Assessment - 11/14/16 1435    Subjective Pt reports a little pain in knee. Has been stretching home.    Pertinent History Essential tremor at baseline. Pt reports he underwent a Rt TKA on 09/27/16. Prior to that, pt reports about 12 months of functional decline.  Bilat rotator cuff repair >4 years ago with good return to funciton.    How long can you stand comfortably? 30-45 minutes   How long can you walk comfortably? Pt has been walking household distances  only since surgery.    Patient Stated Goals Return to PLOF.    Currently in Pain? Yes   Pain Score 3    Pain Location Knee   Pain Orientation Right   Pain Descriptors / Indicators Tightness   Pain Type Surgical pain   Pain Onset 1 to 4 weeks ago   Pain Frequency Intermittent   Aggravating Factors  prolonged sitting and walking   Pain Relieving Factors pain meds activity modification                         [facility] Adult PT Treatment/Exercise - 11/14/16 0001      Knee/Hip Exercises: Stretches   Gastroc Stretch Right;3 reps;30 seconds   Gastroc Stretch Limitations slantboard      Knee/Hip Exercises: Aerobic   Stationary Bike L3 x 6 minutes  Therapist present to discuss treatment     Knee/Hip Exercises: Standing   Knee Flexion Strengthening;Both;2 sets;10 reps  hamstring curls   Hip Abduction Stengthening;Both;2 sets;10 reps   Hip Extension Stengthening;Both;2 sets;10 reps   Rocker Board 2 minutes   Rocker Board Limitations A/P, Lt/Rt     Knee/Hip Exercises: Seated   Sit to Sand without UE support;1 set  2x10     Manual Therapy   Manual Therapy Soft tissue mobilization;Passive ROM   Manual therapy comments separate rest of session   Soft tissue mobilization  Knee and surrounding lagaments   Passive ROM Knee in flexion with 10 second holds                  PT Short Term Goals - 11/14/16 1437      PT SHORT TERM GOAL #2   Title After 3 weeks patient will demonstrate improved functional strength as evidenced by 5xSTS hands free in <13 seconds.    Status On-going     PT SHORT TERM GOAL #3   Title After 3 weeks patient will demonstrate improved functional gait AEB 275ft AMB <60 seconds to improve safety during community distances.    Status Achieved           PT Long Term Goals - 11/14/16 1439      PT LONG TERM GOAL #1   Title After 6 weeks patient will demonstrate improved functional strength as evidenced by 5xSTS hands free in <10  seconds.    Status On-going     PT LONG TERM GOAL #2   Title After 6 weeks pt will demonstrate improved Rt knee flexion ROM on 5-122 degrees to improve mechnics during gait and stairs.    Status On-going     PT LONG TERM GOAL #3   Title After 6 weeks patient will demonstrate improved functional gait AEB 241ft AMB <50 seconds to improve safety during community distances.    Status On-going               Plan - 11/14/16 1509    Clinical Impression Statement Pt presents with antalgic gait pattern due to decreased knee ROM. Pt able to toerate all exercises well. Pt reports he has been trying to do his home exercsies and stretching. Pt will continue to benefit from skilled therapy for knee strength, stability, and ROM to improve gait.    Rehab Potential Good   PT Frequency 2x / week   PT Duration 6 weeks   PT Treatment/Interventions ADLs/Self Care Home Management;Moist Heat;Therapeutic activities;Therapeutic exercise;Balance training;Dry needling;Energy conservation;Taping;Manual techniques;Manual lymph drainage;Passive range of motion;Patient/family education;Gait training;Stair training;Functional mobility training;DME Instruction   PT Next Visit Plan Continue to strengthen and increase ROM   Consulted and Agree with Plan of Care Patient      Patient will benefit from skilled therapeutic intervention in order to improve the following deficits and impairments:  Abnormal gait, Decreased range of motion, Difficulty walking, Increased fascial restricitons, Pain, Decreased activity tolerance, Decreased balance, Hypomobility, Decreased scar mobility, Decreased knowledge of use of DME, Decreased mobility, Decreased strength, Increased edema, Improper body mechanics, Increased muscle spasms  Visit Diagnosis: Acute pain of right knee  Stiffness of right knee, not elsewhere classified  Difficulty in walking, not elsewhere classified     Problem List Patient Active Problem List    Diagnosis Date Noted  . Osteoarthritis of right knee 09/27/2016    Dessa Phi PTA 11/14/2016, 4:05 PM  [facility] [address] [place], [place], [address] Phone: [phone]   Fax:  [phone]  Name: Ricky Bonilla MRN: [id number] Date of Birth: [birth year]

## 2016-11-16 ENCOUNTER — Ambulatory Visit (HOSPITAL_COMMUNITY): Payer: BLUE CROSS/BLUE SHIELD

## 2016-11-16 DIAGNOSIS — M25561 Pain in right knee: Secondary | ICD-10-CM

## 2016-11-16 DIAGNOSIS — M25661 Stiffness of right knee, not elsewhere classified: Secondary | ICD-10-CM

## 2016-11-16 DIAGNOSIS — R262 Difficulty in walking, not elsewhere classified: Secondary | ICD-10-CM

## 2016-11-16 NOTE — Therapy (Signed)
[facility] [address] [place], [place], [address] Phone: [phone]   Fax:  [phone]  Physical Therapy Treatment  Patient Details  Name: Ricky Bonilla MRN: [id number] Date of Birth: 07-Nov-1950 Referring Provider: Tacy Learn  Encounter Date: 11/16/2016      PT End of Session - 11/16/16 1138    Visit Number 10   Number of Visits 12   Date for PT Re-Evaluation 11/26/16   Authorization Type BCBS other    Authorization Time Period 10/15/16- 11/26/16   PT Start Time 1118   PT Stop Time 1202   PT Time Calculation (min) 44 min   Activity Tolerance Patient tolerated treatment well;No increased pain   Behavior During Therapy WFL for tasks assessed/performed      Past Medical History:  Diagnosis Date  . Arthritis   . GERD (gastroesophageal reflux disease)   . Hypertension     Past Surgical History:  Procedure Laterality Date  . APPENDECTOMY    . COLONOSCOPY    . COLONOSCOPY N/A 08/07/2016   Procedure: COLONOSCOPY;  Surgeon: Franky Macho, MD;  [facility];  Service: Gastroenterology;  Laterality: N/A;  . KNEE ARTHROPLASTY Right 09/27/2016   Procedure: RIGHT TOTAL KNEE ARTHROPLASTY WITH COMPUTER NAVIGATION;  Surgeon: Samson Frederic, MD;  Location: [facility];  Service: Orthopedics;  Laterality: Right;  Combined spinal/epidural+regional block  . left rotator cuff repair     and right rotator cuff repair    There were no vitals filed for this visit.      Subjective Assessment - 11/16/16 1134    Subjective Pt stated he may have over done it yesterday, stated it was a pretty day and he did a lot of walking in yard.  Current pain scale 2/10 mainly stiffness   Pertinent History Essential tremor at baseline. Pt reports he underwent a Rt TKA on 09/27/16. Prior to that, pt reports about 12 months of functional decline.  Bilat rotator cuff repair >4 years ago with good return to funciton.    Patient Stated Goals Return to PLOF.     Currently in Pain? Yes   Pain Score 2    Pain Location Knee   Pain Orientation Right   Pain Descriptors / Indicators Tightness  stiffness   Pain Type Surgical pain   Pain Onset 1 to 4 weeks ago   Pain Frequency Intermittent   Aggravating Factors  prolonged sitting and walking   Pain Relieving Factors pain meds activity modification                         [facility] Adult PT Treatment/Exercise - 11/16/16 0001      Knee/Hip Exercises: Stretches   Active Hamstring Stretch 4 reps;30 seconds   Active Hamstring Stretch Limitations supine with rope     Knee/Hip Exercises: Standing   Rocker Board 2 minutes   Rocker Board Limitations R/L with intermittent HHA   Gait Training cueingto improve heel strike for knee extension through session.      Knee/Hip Exercises: Seated   Long Arc Quad Right;1 set;15 reps   Long [employer] Limitations cueing for heel lift to improve knee /   Sit to [employer] without UE support;15 reps     Knee/Hip Exercises: Supine   Quad Sets 15 reps;Right;AROM   Short Arc Quad Sets 15 reps   Heel Slides 10 reps   Knee Extension Limitations 8 degrees lacking extension   Knee Flexion Limitations  115 degrees flexion     Knee/Hip Exercises: Prone   Prone Knee Hang 4 minutes   Prone Knee Hang Limitations Manual STM and trigger point release to medial hamstrings     Manual Therapy   Manual Therapy Edema management;Soft tissue mobilization;Joint mobilization   Manual therapy comments separate rest of session   Edema Management RLE elevated, retrograde massage x10 min   Joint Mobilization Patella mobs all direction; tib/fib mobs   Soft tissue mobilization lateral quad and medial hamstring during Prone knee hang   Myofascial Release to tightness/adhesions mostly lateral knee and over scar.                  PT Short Term Goals - 11/14/16 1437      PT SHORT TERM GOAL #2   Title After 3 weeks patient will demonstrate improved functional strength  as evidenced by 5xSTS hands free in <13 seconds.    Status On-going     PT SHORT TERM GOAL #3   Title After 3 weeks patient will demonstrate improved functional gait AEB 27025ft AMB <60 seconds to improve safety during community distances.    Status Achieved           PT Long Term Goals - 11/14/16 1439      PT LONG TERM GOAL #1   Title After 6 weeks patient will demonstrate improved functional strength as evidenced by 5xSTS hands free in <10 seconds.    Status On-going     PT LONG TERM GOAL #2   Title After 6 weeks pt will demonstrate improved Rt knee flexion ROM on 5-122 degrees to improve mechnics during gait and stairs.    Status On-going     PT LONG TERM GOAL #3   Title After 6 weeks patient will demonstrate improved functional gait AEB 22925ft AMB <50 seconds to improve safety during community distances.    Status On-going               Plan - 11/16/16 1205    Clinical Impression Statement Pt entered dept with increased antalgic gait mechanics wtih reports of possibly over doing it yesterday with the pretty day and walking outside.  Session focus on improving knee mobility with therex and manual technqiues.  Noted edema present and tight musculature on lateral aspect of quad and medial hamstrings.  Manual retro massage complete for edema control and soft tissue mobilization/trigger point release technqiues to reduce stiffness and improve range.  Therex focus on extension for quad strengthening and range today, pt tolerated well to all exercises.  Improved ROM at EOS to 8-115 degrees.  Min cueing to increased stance phase to Rt LE with gait mechanics.  No reports of increased pain through session.     Rehab Potential Good   PT Frequency 2x / week   PT Duration 6 weeks   PT Treatment/Interventions ADLs/Self Care Home Management;Moist Heat;Therapeutic activities;Therapeutic exercise;Balance training;Dry needling;Energy conservation;Taping;Manual techniques;Manual lymph  drainage;Passive range of motion;Patient/family education;Gait training;Stair training;Functional mobility training;DME Instruction   PT Next Visit Plan Focus on improving ROM with quad strengthening, hamstring stretches and gluteal strengthening for gait mechanics.     PT Home Exercise Plan Updated from eval: AMB 2x daily 10-minutes, LAQ 1x15x3sH, SAQ, heel slides ad lib, bridging updated to straight leg only; 11/07/16 added prone knee hang, long sitting stretch c weight, prone knee flexion.       Patient will benefit from skilled therapeutic intervention in order to improve the following deficits and impairments:  Abnormal gait, Decreased range of motion, Difficulty walking, Increased fascial restricitons, Pain, Decreased activity tolerance, Decreased balance, Hypomobility, Decreased scar mobility, Decreased knowledge of use of DME, Decreased mobility, Decreased strength, Increased edema, Improper body mechanics, Increased muscle spasms  Visit Diagnosis: Acute pain of right knee  Stiffness of right knee, not elsewhere classified  Difficulty in walking, not elsewhere classified     Problem List Patient Active Problem List   Diagnosis Date Noted  . Osteoarthritis of right knee 09/27/2016   Becky Sax, LPTA; CBIS [phone]  Juel Burrow 11/16/2016, 12:14 PM  [facility] [address] [place], [place], [address] Phone: [phone]   Fax:  [phone]  Name: Ricky Bonilla MRN: [id number] Date of Birth: May 05, 1951

## 2016-11-21 ENCOUNTER — Ambulatory Visit (HOSPITAL_COMMUNITY): Payer: BLUE CROSS/BLUE SHIELD | Admitting: Physical Therapy

## 2016-11-21 DIAGNOSIS — M25661 Stiffness of right knee, not elsewhere classified: Secondary | ICD-10-CM

## 2016-11-21 DIAGNOSIS — M25561 Pain in right knee: Secondary | ICD-10-CM | POA: Diagnosis not present

## 2016-11-21 DIAGNOSIS — R262 Difficulty in walking, not elsewhere classified: Secondary | ICD-10-CM

## 2016-11-21 NOTE — Therapy (Addendum)
[facility] [address] [place], [place], [address] Phone: [phone]   Fax:  [phone]  Physical Therapy Treatment  Patient Details  Name: Ricky Bonilla MRN: [id number] Date of Birth: September 16, 1951 Referring Provider: Tacy Learn  Encounter Date: 11/21/2016      PT End of Session - 11/21/16 1516    Visit Number 11   Number of Visits 12   Date for PT Re-Evaluation 11/26/16   Authorization Type BCBS other    Authorization Time Period 10/15/16- 11/26/16   PT Start Time 1433   PT Stop Time 1515   PT Time Calculation (min) 42 min   Activity Tolerance Patient tolerated treatment well;No increased pain   Behavior During Therapy WFL for tasks assessed/performed      Past Medical History:  Diagnosis Date  . Arthritis   . GERD (gastroesophageal reflux disease)   . Hypertension     Past Surgical History:  Procedure Laterality Date  . APPENDECTOMY    . COLONOSCOPY    . COLONOSCOPY N/A 08/07/2016   Procedure: COLONOSCOPY;  Surgeon: Franky Macho, MD;  [facility];  Service: Gastroenterology;  Laterality: N/A;  . KNEE ARTHROPLASTY Right 09/27/2016   Procedure: RIGHT TOTAL KNEE ARTHROPLASTY WITH COMPUTER NAVIGATION;  Surgeon: Samson Frederic, MD;  Location: [facility];  Service: Orthopedics;  Laterality: Right;  Combined spinal/epidural+regional block  . left rotator cuff repair     and right rotator cuff repair    There were no vitals filed for this visit.      Subjective Assessment - 11/21/16 1438    Subjective Pt reports soreness and stiffness and pain to 3/10.  Reports compliance with HEP.   Currently in Pain? Yes   Pain Score 3    Pain Location Knee   Pain Orientation Right   Pain Descriptors / Indicators Tightness;Sore                         [facility] Adult PT Treatment/Exercise - 11/21/16 0001      Knee/Hip Exercises: Stretches   Active Hamstring Stretch 3 reps;20 seconds   Active Hamstring  Stretch Limitations 12" step   Knee: Self-Stretch to increase Flexion 10 seconds   Knee: Self-Stretch Limitations 12" box 10 reps   Gastroc Stretch Right;3 reps;30 seconds   Gastroc Stretch Limitations slantboard      Knee/Hip Exercises: Standing   Knee Flexion Right;15 reps   Hip Abduction Both;15 reps   Hip Extension Both;15 reps     Knee/Hip Exercises: Supine   Quad Sets 15 reps;Right;AROM   Short Arc Quad Sets 15 reps   Heel Slides 10 reps   Knee Extension Limitations 8 degrees lacking extension   Knee Flexion Limitations 118 degrees flexion     Manual Therapy   Manual Therapy Myofascial release;Passive ROM;Edema management   Manual therapy comments following therex, seperate from all other skilled interventions   Edema Management retrograde in supine prior to MFR and passive   Myofascial Release lateral and medial inferior knee in prone followed by PROM   Passive ROM knee extension in prone with distraction                  PT Short Term Goals - 11/14/16 1437      PT SHORT TERM GOAL #2   Title After 3 weeks patient will demonstrate improved functional strength as evidenced by 5xSTS hands free in <13 seconds.    Status On-going  PT SHORT TERM GOAL #3   Title After 3 weeks patient will demonstrate improved functional gait AEB 23125ft AMB <60 seconds to improve safety during community distances.    Status Achieved           PT Long Term Goals - 11/14/16 1439      PT LONG TERM GOAL #1   Title After 6 weeks patient will demonstrate improved functional strength as evidenced by 5xSTS hands free in <10 seconds.    Status On-going     PT LONG TERM GOAL #2   Title After 6 weeks pt will demonstrate improved Rt knee flexion ROM on 5-122 degrees to improve mechnics during gait and stairs.    Status On-going     PT LONG TERM GOAL #3   Title After 6 weeks patient will demonstrate improved functional gait AEB 27025ft AMB <50 seconds to improve safety during  community distances.    Status On-going               Plan - 11/21/16 1518    Clinical Impression Statement Pt with improving gait, however main complaint of stiffness in knee not allowing full extension.  Spent great deal of time on myofascial, specifically to medial/lateral inferior knee to loosen adhesions.  Followed myofascial with PROM for extension in prone lying.  Pt pleased with end result with elimination of "tight band" around knee.  Pt with improved heel strike and decreased antalgia following manual. Pt requested we work on this each session as it has been beneficial.  ROM at end of session 8-118.   Rehab Potential Good   PT Frequency 2x / week   PT Duration 6 weeks   PT Treatment/Interventions ADLs/Self Care Home Management;Moist Heat;Therapeutic activities;Therapeutic exercise;Balance training;Dry needling;Energy conservation;Taping;Manual techniques;Manual lymph drainage;Passive range of motion;Patient/family education;Gait training;Stair training;Functional mobility training;DME Instruction   PT Next Visit Plan Focus on improving ROM, specifically knee extension.    Myofascial techniques and PROM for extension each session as needed to loosen adhesions. Re-evaluate next session.   PT Home Exercise Plan Updated from eval: AMB 2x daily 10-minutes, LAQ 1x15x3sH, SAQ, heel slides ad lib, bridging updated to straight leg only; 11/07/16 added prone knee hang, long sitting stretch c weight, prone knee flexion.       Patient will benefit from skilled therapeutic intervention in order to improve the following deficits and impairments:  Abnormal gait, Decreased range of motion, Difficulty walking, Increased fascial restricitons, Pain, Decreased activity tolerance, Decreased balance, Hypomobility, Decreased scar mobility, Decreased knowledge of use of DME, Decreased mobility, Decreased strength, Increased edema, Improper body mechanics, Increased muscle spasms  Visit Diagnosis: Acute  pain of right knee  Stiffness of right knee, not elsewhere classified  Difficulty in walking, not elsewhere classified     Problem List Patient Active Problem List   Diagnosis Date Noted  . Osteoarthritis of right knee 09/27/2016    Lurena Nidamy B Debra Calabretta, PTA/CLT [phone]  11/21/2016, 3:41 PM   [facility] [address] [place], [place], [address] Phone: [phone]   Fax:  [phone]  Name: Ricky Bonilla MRN: [id number] Date of Birth: 03/05/1951

## 2016-11-26 ENCOUNTER — Ambulatory Visit (HOSPITAL_COMMUNITY): Payer: BLUE CROSS/BLUE SHIELD

## 2016-11-26 DIAGNOSIS — M25561 Pain in right knee: Secondary | ICD-10-CM

## 2016-11-26 DIAGNOSIS — M25661 Stiffness of right knee, not elsewhere classified: Secondary | ICD-10-CM

## 2016-11-26 DIAGNOSIS — R262 Difficulty in walking, not elsewhere classified: Secondary | ICD-10-CM

## 2016-11-26 NOTE — Therapy (Addendum)
PHYSICAL THERAPY DISCHARGE SUMMARY  Visits from Start of Care: 12  Current functional level related to goals / functional outcomes: *see below    Remaining deficits: *see below    Education / Equipment: *see below  Plan: Patient agrees to discharge.  Patient goals were met. Patient is being discharged due to meeting the stated rehab goals.  ?????   3:44 PM, 11/26/16 Etta Grandchild, PT, DPT Physical Therapist at [place] [phone] (office)                             [facility] [address] [place], [place], [address] Phone: [phone]   Fax:  [phone]  Physical Therapy Treatment  Patient Details  Name: Ricky Bonilla MRN: [id number] Date of Birth: May 28, 1951 Referring Provider: Rod Can   Encounter Date: 11/26/2016      PT End of Session - 11/26/16 1532    Visit Number 12   Number of Visits 12   Date for PT Re-Evaluation 11/26/16   Authorization Type BCBS other    Authorization Time Period 10/15/16- 11/26/16   PT Start Time 0177   PT Stop Time 1520   PT Time Calculation (min) 43 min   Activity Tolerance Patient tolerated treatment well;No increased pain   Behavior During Therapy WFL for tasks assessed/performed      Past Medical History:  Diagnosis Date  . Arthritis   . GERD (gastroesophageal reflux disease)   . Hypertension     Past Surgical History:  Procedure Laterality Date  . APPENDECTOMY    . COLONOSCOPY    . COLONOSCOPY N/A 08/07/2016   Procedure: COLONOSCOPY;  Surgeon: Aviva Signs, MD;  [facility];  Service: Gastroenterology;  Laterality: N/A;  . KNEE ARTHROPLASTY Right 09/27/2016   Procedure: RIGHT TOTAL KNEE ARTHROPLASTY WITH COMPUTER NAVIGATION;  Surgeon: Rod Can, MD;  [facility];  Service: Orthopedics;  Laterality: Right;  Combined spinal/epidural+regional block  . left rotator cuff repair     and right  rotator cuff repair    There were no vitals filed for this visit.      Subjective Assessment - 11/26/16 1442    Subjective Pt doing well. Still c/o stiffness in morning which he is able to gradually work out over time, sometimes getting on his exercise bike about 44mnutes daily, sometimes BId, espeically if he can't get out to walk.    Pertinent History Essential tremor at baseline. Pt reports he underwent a Rt TKA on 09/27/16. Prior to that, pt reports about 12 months of functional decline.  Bilat rotator cuff repair >4 years ago with good return to funciton.    How long can you sit comfortably? No problem in the car for up to 45 minutes   How long can you stand comfortably? estimated at 1 hour.    How long can you walk comfortably? up to 30 minutes at a time without trouble.   Patient Stated Goals Return to PLOF.    Currently in Pain? No/denies            [facility]      Assessment   Medical Diagnosis Rt TKA    Referring Provider BRod Can   Onset Date/Surgical Date 09/27/16   Hand Dominance Right   Next MD Visit First week of feb   Prior Therapy HHPT only, now DC.  Precautions   Precautions None     Restrictions   Weight Bearing Restrictions No     Balance Screen   Has the patient fallen in the past 6 months No     Observation/Other Assessments-Edema    Edema --  continued postoperative swelling     PROM   Overall PROM  Deficits  12-119* at DC; 22-111* 2/7; 16-106 degrees at eval                     [facility] Adult PT Treatment/Exercise - 11/26/16 0001      Transfers   Five time sit to stand comments  9.54sec hands free  5xSTS: 14.10s hands free on 11/07/16     Ambulation/Gait   Ambulation Distance (Feet) 1705 Feet  6MWT   Assistive device None   Gait velocity 1.68ms  1.158m on 2/7     Knee/Hip Exercises: Stretches   Knee: Self-Stretch to increase Flexion 10 seconds   Knee: Self-Stretch Limitations 12" box  10 reps   Other Knee/Hip Stretches AA/ROM on NuStep for Knee Flexion ROM, 5 inutes level 2      Knee/Hip Exercises: Standing   Step Down 2 sets;Right;Step Height: 4";Step Height: 2"  1x3 on 4"; 2x8 on 2"      Knee/Hip Exercises: Seated   Other Seated Knee/Hip Exercises QUad sets in long sitting, heel propper for TKE stretch  15x3sec     Knee/Hip Exercises: Prone   Straight Leg Raises Limitations Prone TKE: 10x3sec                PT Education - 11/26/16 1532    Education provided Yes   Education Details updated HEP for DC strengthening of quads.    Person(s) Educated Patient   Methods Explanation;Demonstration   Comprehension Verbalized understanding          PT Short Term Goals - 11/26/16 1536      PT SHORT TERM GOAL #1   Title After 3 weeks pt will demonstrate improved Rt knee flexion ROM on 10-114 degrees to improve mechnics during gait and stairs.    Baseline ROM still limited, but very close to goal: 11-111 degrees Rt knee flexion on 11/07/16   Status Achieved     PT SHORT TERM GOAL #2   Title After 3 weeks patient will demonstrate improved functional strength as evidenced by 5xSTS hands free in <13 seconds.    Baseline Hands free in 14.10s on 2/7   Status On-going     PT SHORT TERM GOAL #3   Title After 3 weeks patient will demonstrate improved functional gait AEB 22540fMB <60 seconds to improve safety during community distances.    Baseline Performed 3x consecutively during 3MWT on 2/7    Status Achieved           PT Long Term Goals - 11/26/16 1537      PT LONG TERM GOAL #1   Title After 6 weeks patient will demonstrate improved functional strength as evidenced by 5xSTS hands free in <10 seconds.    Baseline Performed in 9.5sec on 2/26.    Status Achieved     PT LONG TERM GOAL #2   Title After 6 weeks pt will demonstrate improved Rt knee flexion ROM on 5-122 degrees to improve mechnics during gait and stairs.    Baseline on 2/26 limited to 12-119  degrees Rt knee flexion.    Status On-going     PT LONG TERM GOAL #3  Title After 6 weeks patient will demonstrate improved functional gait AEB 258f AMB <50 seconds to improve safety during community distances.    Baseline 2270fin 51s on 11/26/16; 6MWT @ 1.4467m   Status Achieved               Plan - 11/26/16 1533    Clinical Impression Statement Pt reassessed for DC today, with demonstrate improvement in gait quality, gait speed, isolated strength in RLE, functional strength AEB 5xSTS, and decreased pain. Remaining deficits include limited terminal knee extension ROM lacking 12 degrees from neurtal, as well as continued effusion and warmth. Pt is ready for DC at this time, with updated HEP conitnue to progress function over the next 3 months. Most goals short term adn long term met, with exception to those related to ROM in the knee itself.    Rehab Potential Good   PT Frequency 2x / week   PT Duration 6 weeks   PT Treatment/Interventions ADLs/Self Care Home Management;Moist Heat;Therapeutic activities;Therapeutic exercise;Balance training;Dry needling;Energy conservation;Taping;Manual techniques;Manual lymph drainage;Passive range of motion;Patient/family education;Gait training;Stair training;Functional mobility training;DME Instruction   Consulted and Agree with Plan of Care Patient      Patient will benefit from skilled therapeutic intervention in order to improve the following deficits and impairments:  Abnormal gait, Decreased range of motion, Difficulty walking, Increased fascial restricitons, Pain, Decreased activity tolerance, Decreased balance, Hypomobility, Decreased scar mobility, Decreased knowledge of use of DME, Decreased mobility, Decreased strength, Increased edema, Improper body mechanics, Increased muscle spasms  Visit Diagnosis: Acute pain of right knee  Stiffness of right knee, not elsewhere classified  Difficulty in walking, not elsewhere  classified     Problem List Patient Active Problem List   Diagnosis Date Noted  . Osteoarthritis of right knee 09/27/2016    3:40 PM, 11/26/16 AllEtta GrandchildT, DPT Physical Therapist at [phone])     [place] [place]: [phone]:  [phone]: Ricky SEDGWICKN: [id number] of Birth: 1/421-Dec-1952

## 2017-06-14 IMAGING — DX DG KNEE 1-2V PORT*R*
2 series · 2 of 2 positions shown · non-contrast
Comparison: None.

CLINICAL DATA: Status post right total knee arthroplasty

EXAM:
PORTABLE RIGHT KNEE - 1-2 VIEW

[knee ap]
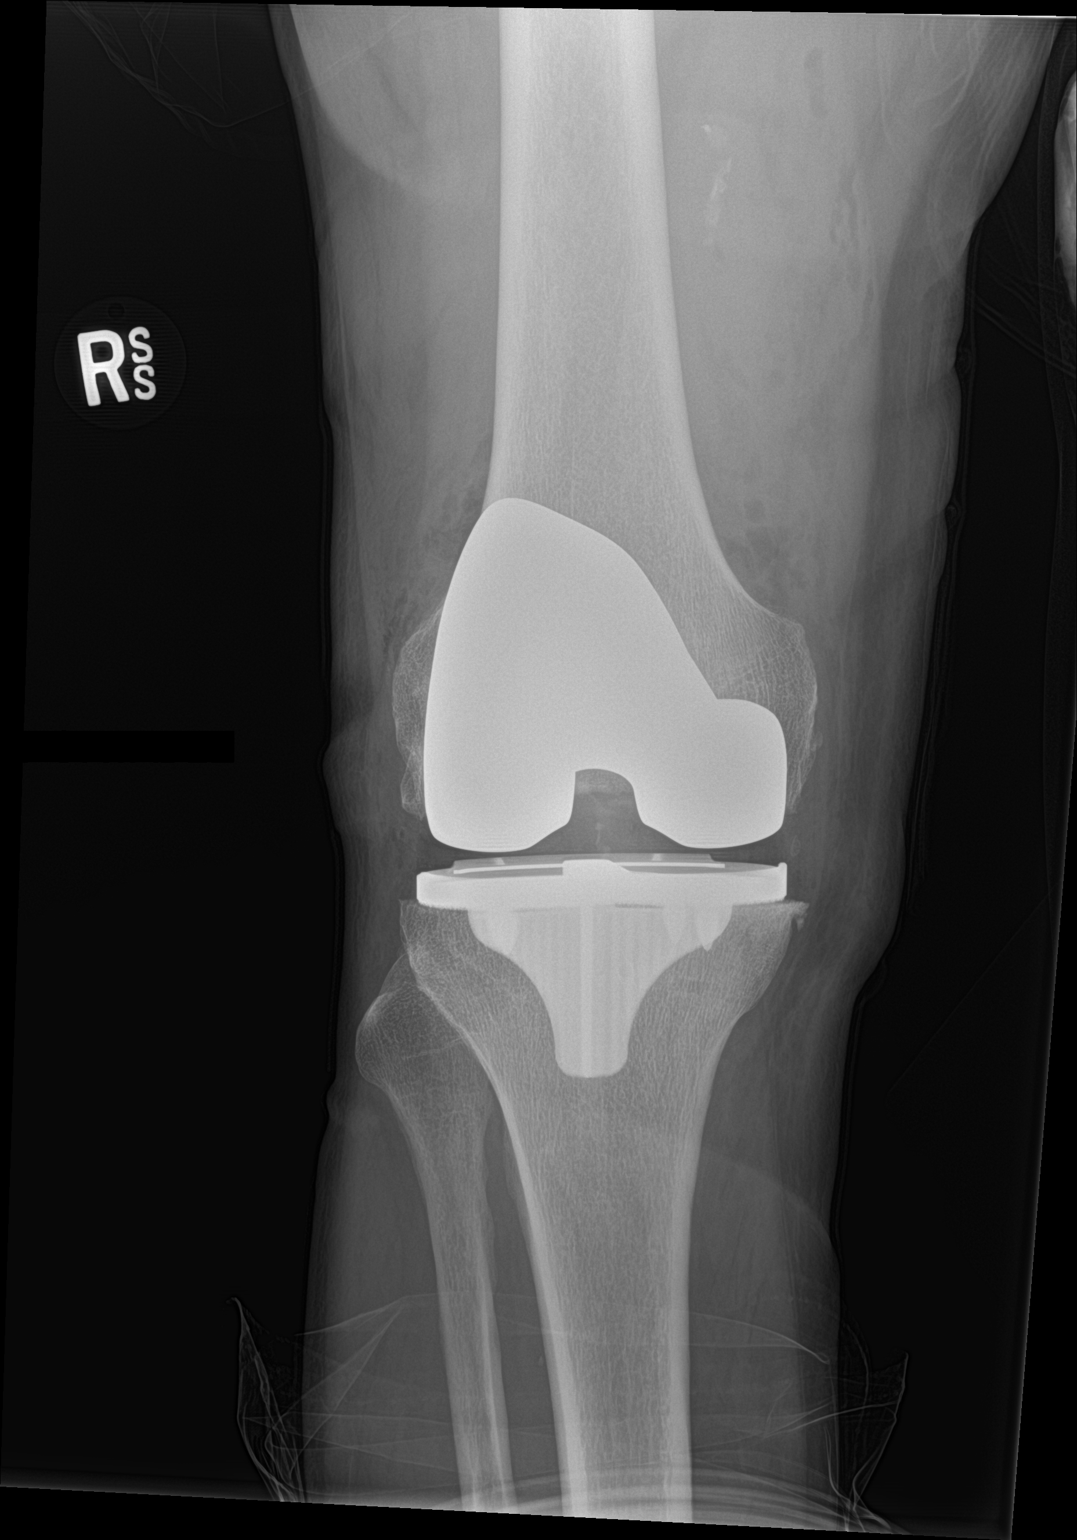

[knee lat]
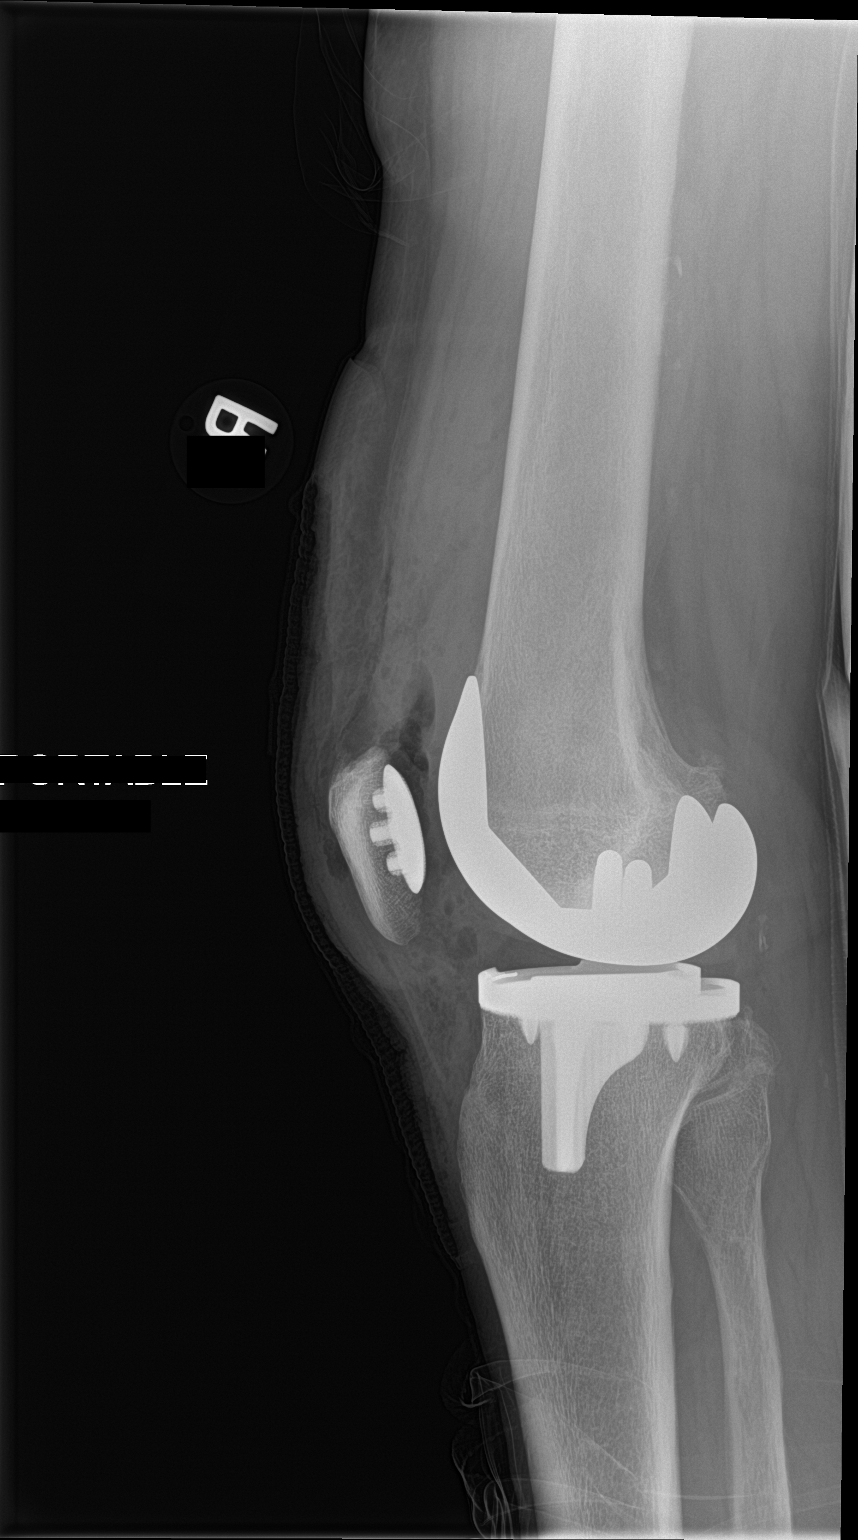

[2 of 2 positions shown; findings below may reference images not displayed]

FINDINGS: Status post right total knee arthroplasty with well-positioned right
distal femoral, right proximal tibial and right posterior patellar
prostheses with no evidence of prosthetic fracture or loosening. No
acute osseous fracture. No suspicious focal osseous lesion. No
dislocation. Expected soft tissue gas within and surrounding the
right knee joint.
IMPRESSION: Satisfactory immediate postoperative appearance status post right
total knee arthroplasty.

## 2018-06-03 ENCOUNTER — Ambulatory Visit: Payer: Self-pay | Admitting: Orthopedic Surgery

## 2018-06-09 ENCOUNTER — Ambulatory Visit: Payer: Self-pay | Admitting: Orthopedic Surgery

## 2018-06-09 NOTE — H&P (View-Only) (Signed)
TOTAL KNEE ADMISSION H&P  Patient is being admitted for left total knee arthroplasty.  Subjective:  Chief Complaint:left knee pain.  HPI: Ricky Bonilla, 67 y.o. male, has a history of pain and functional disability in the left knee due to arthritis and has failed non-surgical conservative treatments for greater than 12 weeks to includeNSAID's and/or analgesics, corticosteriod injections, flexibility and strengthening excercises, use of assistive devices, weight reduction as appropriate and activity modification.  Onset of symptoms was gradual, starting 4 years ago with rapidlly worsening course since that time. The patient noted no past surgery on the left knee(s).  Patient currently rates pain in the left knee(s) at 10 out of 10 with activity. Patient has night pain, worsening of pain with activity and weight bearing, pain that interferes with activities of daily living, pain with passive range of motion, crepitus and joint swelling.  Patient has evidence of subchondral cysts, subchondral sclerosis, periarticular osteophytes and joint space narrowing by imaging studies.There is no active infection.  Patient Active Problem List   Diagnosis Date Noted  . Osteoarthritis of right knee 09/27/2016   Past Medical History:  Diagnosis Date  . Arthritis   . GERD (gastroesophageal reflux disease)   . Hypertension     Past Surgical History:  Procedure Laterality Date  . APPENDECTOMY    . COLONOSCOPY    . COLONOSCOPY N/A 08/07/2016   Procedure: COLONOSCOPY;  Surgeon: Mark Jenkins, MD;  [facility]: [facility];  Service: Gastroenterology;  Laterality: N/A;  . KNEE ARTHROPLASTY Right 09/27/2016   Procedure: RIGHT TOTAL KNEE ARTHROPLASTY WITH COMPUTER NAVIGATION;  Surgeon: Harris Penton, MD;  Location: [facility];  Service: Orthopedics;  Laterality: Right;  Combined spinal/epidural+regional block  . left rotator cuff repair     and right rotator cuff repair    Current Outpatient Medications  Medication  Sig Dispense Refill Last Dose  . amLODipine (NORVASC) 10 MG tablet Take 10 mg by mouth daily.   09/27/2016 at 0950  . aspirin 81 MG chewable tablet Chew 1 tablet (81 mg total) by mouth 2 (two) times daily. (Patient taking differently: Chew 81 mg by mouth every other day. ) 60 tablet 1   . fosinopril (MONOPRIL) 40 MG tablet Take 40 mg by mouth daily.   09/26/2016 at Unknown time  . Multiple Vitamin (MULTIVITAMIN WITH MINERALS) TABS tablet Take 1 tablet by mouth daily.   09/25/2016  . Omega-3 Fatty Acids (FISH OIL PO) Take 1 capsule by mouth daily.     . propranolol (INDERAL) 80 MG tablet Take 40-80 mg by mouth daily as needed (tremors).    09/27/2016 at 0950   No current facility-administered medications for this visit.    No Known Allergies  Social History   Tobacco Use  . Smoking status: Former Smoker    Packs/day: 1.00    Years: 8.00    Pack years: 8.00    Types: Cigarettes    Last attempt to quit: 07/25/1977    Years since quitting: 40.9  . Smokeless tobacco: Former User    Quit date: 07/20/1989  Substance Use Topics  . Alcohol use: Yes    Family History  Problem Relation Age of Onset  . Colon cancer Neg Hx      Review of Systems  Constitutional: Negative.   HENT: Positive for hearing loss.   Eyes: Negative.   Respiratory: Negative.   Cardiovascular: Negative.   Gastrointestinal: Negative.   Genitourinary: Negative.   Musculoskeletal: Positive for joint pain.  Skin: Negative.     Neurological: Negative.   Endo/Heme/Allergies: Negative.   Psychiatric/Behavioral: Negative.     Objective:  Physical Exam  Vitals reviewed. Constitutional: He is oriented to person, place, and time. He appears well-developed and well-nourished.  HENT:  Head: Normocephalic and atraumatic.  Eyes: Pupils are equal, round, and reactive to light. Conjunctivae and EOM are normal.  Neck: Normal range of motion. Neck supple.  Cardiovascular: Normal rate, regular rhythm and intact distal  pulses.  Respiratory: Effort normal. No respiratory distress.  GI: Soft. He exhibits no distension.  Genitourinary:  Genitourinary Comments: deferred  Musculoskeletal:       Left knee: He exhibits decreased range of motion, swelling, effusion and bony tenderness. Tenderness found. Medial joint line and lateral joint line tenderness noted.  Neurological: He is oriented to person, place, and time. He has normal reflexes.  Skin: Skin is warm and dry.  Psychiatric: He has a normal mood and affect. His behavior is normal. Judgment and thought content normal.    Vital signs in last 24 hours: @VSRANGES@  Labs:   Estimated body mass index is 31.68 kg/m as calculated from the following:   Height as of 09/27/16: 6' (1.829 m).   Weight as of 09/27/16: 106 kg.   Imaging Review Plain radiographs demonstrate severe degenerative joint disease of the left knee(s). The overall alignment issignificant varus. The bone quality appears to be adequate for age and reported activity level.   Preoperative templating of the joint replacement has been completed, documented, and submitted to the Operating Room personnel in order to optimize intra-operative equipment management.    Patient's anticipated LOS is less than 2 midnights, meeting these requirements: - Younger than 65 - Lives within 1 hour of care - Has a competent adult at home to recover with post-op recover - NO history of  - Chronic pain requiring opiods  - Diabetes  - Coronary Artery Disease  - Heart failure  - Heart attack  - Stroke  - DVT/VTE  - Cardiac arrhythmia  - Respiratory Failure/COPD  - Renal failure  - Anemia  - Advanced Liver disease        Assessment/Plan:  End stage arthritis, left knee   The patient history, physical examination, clinical judgment of the provider and imaging studies are consistent with end stage degenerative joint disease of the left knee(s) and total knee arthroplasty is deemed medically  necessary. The treatment options including medical management, injection therapy arthroscopy and arthroplasty were discussed at length. The risks and benefits of total knee arthroplasty were presented and reviewed. The risks due to aseptic loosening, infection, stiffness, patella tracking problems, thromboembolic complications and other imponderables were discussed. The patient acknowledged the explanation, agreed to proceed with the plan and consent was signed. Patient is being admitted for inpatient treatment for surgery, pain control, PT, OT, prophylactic antibiotics, VTE prophylaxis, progressive ambulation and ADL's and discharge planning. The patient is planning to be discharged home with outpatient PT  

## 2018-06-09 NOTE — H&P (Signed)
TOTAL KNEE ADMISSION H&P  Patient is being admitted for left total knee arthroplasty.  Subjective:  Chief Complaint:left knee pain.  HPI: Ricky Bonilla, 67 y.o. male, has a history of pain and functional disability in the left knee due to arthritis and has failed non-surgical conservative treatments for greater than 12 weeks to includeNSAID's and/or analgesics, corticosteriod injections, flexibility and strengthening excercises, use of assistive devices, weight reduction as appropriate and activity modification.  Onset of symptoms was gradual, starting 4 years ago with rapidlly worsening course since that time. The patient noted no past surgery on the left knee(s).  Patient currently rates pain in the left knee(s) at 10 out of 10 with activity. Patient has night pain, worsening of pain with activity and weight bearing, pain that interferes with activities of daily living, pain with passive range of motion, crepitus and joint swelling.  Patient has evidence of subchondral cysts, subchondral sclerosis, periarticular osteophytes and joint space narrowing by imaging studies.There is no active infection.  Patient Active Problem List   Diagnosis Date Noted  . Osteoarthritis of right knee 09/27/2016   Past Medical History:  Diagnosis Date  . Arthritis   . GERD (gastroesophageal reflux disease)   . Hypertension     Past Surgical History:  Procedure Laterality Date  . APPENDECTOMY    . COLONOSCOPY    . COLONOSCOPY N/A 08/07/2016   Procedure: COLONOSCOPY;  Surgeon: Franky Macho, MD;  [facility];  Service: Gastroenterology;  Laterality: N/A;  . KNEE ARTHROPLASTY Right 09/27/2016   Procedure: RIGHT TOTAL KNEE ARTHROPLASTY WITH COMPUTER NAVIGATION;  Surgeon: Samson Frederic, MD;  Location: [facility];  Service: Orthopedics;  Laterality: Right;  Combined spinal/epidural+regional block  . left rotator cuff repair     and right rotator cuff repair    Current Outpatient Medications  Medication  Sig Dispense Refill Last Dose  . amLODipine (NORVASC) 10 MG tablet Take 10 mg by mouth daily.   09/27/2016 at 0950  . aspirin 81 MG chewable tablet Chew 1 tablet (81 mg total) by mouth 2 (two) times daily. (Patient taking differently: Chew 81 mg by mouth every other day. ) 60 tablet 1   . fosinopril (MONOPRIL) 40 MG tablet Take 40 mg by mouth daily.   09/26/2016 at Unknown time  . Multiple Vitamin (MULTIVITAMIN WITH MINERALS) TABS tablet Take 1 tablet by mouth daily.   09/25/2016  . Omega-3 Fatty Acids (FISH OIL PO) Take 1 capsule by mouth daily.     . propranolol (INDERAL) 80 MG tablet Take 40-80 mg by mouth daily as needed (tremors).    09/27/2016 at 0950   No current facility-administered medications for this visit.    No Known Allergies  Social History   Tobacco Use  . Smoking status: Former Smoker    Packs/day: 1.00    Years: 8.00    Pack years: 8.00    Types: Cigarettes    Last attempt to quit: 07/25/1977    Years since quitting: 40.9  . Smokeless tobacco: Former Neurosurgeon    Quit date: 07/20/1989  Substance Use Topics  . Alcohol use: Yes    Family History  Problem Relation Age of Onset  . Colon cancer Neg Hx      Review of Systems  Constitutional: Negative.   HENT: Positive for hearing loss.   Eyes: Negative.   Respiratory: Negative.   Cardiovascular: Negative.   Gastrointestinal: Negative.   Genitourinary: Negative.   Musculoskeletal: Positive for joint pain.  Skin: Negative.  Neurological: Negative.   Endo/Heme/Allergies: Negative.   Psychiatric/Behavioral: Negative.     Objective:  Physical Exam  Vitals reviewed. Constitutional: He is oriented to person, place, and time. He appears well-developed and well-nourished.  HENT:  Head: Normocephalic and atraumatic.  Eyes: Pupils are equal, round, and reactive to light. Conjunctivae and EOM are normal.  Neck: Normal range of motion. Neck supple.  Cardiovascular: Normal rate, regular rhythm and intact distal  pulses.  Respiratory: Effort normal. No respiratory distress.  GI: Soft. He exhibits no distension.  Genitourinary:  Genitourinary Comments: deferred  Musculoskeletal:       Left knee: He exhibits decreased range of motion, swelling, effusion and bony tenderness. Tenderness found. Medial joint line and lateral joint line tenderness noted.  Neurological: He is oriented to person, place, and time. He has normal reflexes.  Skin: Skin is warm and dry.  Psychiatric: He has a normal mood and affect. His behavior is normal. Judgment and thought content normal.    Vital signs in last 24 hours: @VSRANGES @  Labs:   Estimated body mass index is 31.68 kg/m as calculated from the following:   Height as of 09/27/16: 6' (1.829 m).   Weight as of 09/27/16: 106 kg.   Imaging Review Plain radiographs demonstrate severe degenerative joint disease of the left knee(s). The overall alignment issignificant varus. The bone quality appears to be adequate for age and reported activity level.   Preoperative templating of the joint replacement has been completed, documented, and submitted to the Operating Room personnel in order to optimize intra-operative equipment management.    Patient's anticipated LOS is less than 2 midnights, meeting these requirements: - Younger than 37 - Lives within 1 hour of care - Has a competent adult at home to recover with post-op recover - NO history of  - Chronic pain requiring opiods  - Diabetes  - Coronary Artery Disease  - Heart failure  - Heart attack  - Stroke  - DVT/VTE  - Cardiac arrhythmia  - Respiratory Failure/COPD  - Renal failure  - Anemia  - Advanced Liver disease        Assessment/Plan:  End stage arthritis, left knee   The patient history, physical examination, clinical judgment of the provider and imaging studies are consistent with end stage degenerative joint disease of the left knee(s) and total knee arthroplasty is deemed medically  necessary. The treatment options including medical management, injection therapy arthroscopy and arthroplasty were discussed at length. The risks and benefits of total knee arthroplasty were presented and reviewed. The risks due to aseptic loosening, infection, stiffness, patella tracking problems, thromboembolic complications and other imponderables were discussed. The patient acknowledged the explanation, agreed to proceed with the plan and consent was signed. Patient is being admitted for inpatient treatment for surgery, pain control, PT, OT, prophylactic antibiotics, VTE prophylaxis, progressive ambulation and ADL's and discharge planning. The patient is planning to be discharged home with outpatient PT

## 2018-06-09 NOTE — Patient Instructions (Addendum)
Ricky Bonilla  DOB  1951/02/08     Your procedure is scheduled on:  06-11-2018   Report to [facility] Main  Entrance,  Report to admitting at 6:00 AM    Call this number if you have problems the morning of surgery [phone]                Remember: Do not eat food or drink liquids :After Midnight.     Take these medicines the morning of surgery with A SIP OF WATER:  AMLODIPINE, PROPRANOLOL IF NEEDED                                You may not have any metal on your body including hair pins and              piercings                Do not wear jewelry, make-up, lotions, powders or perfumes, deodorant              Men may shave face and neck.   Do not bring valuables to the hospital. [place] IS NOT             RESPONSIBLE   FOR VALUABLES.  Contacts, dentures or bridgework may not be worn into surgery.  Leave suitcase in the car. After surgery it may be brought to your room.     Special Instructions: N/A              Please read over the following fact sheets you were given: _____________________________________________________________________            WHAT IS A BLOOD TRANSFUSION? Blood Transfusion Information  A transfusion is the replacement of blood or some of its parts. Blood is made up of multiple cells which provide different functions.  Red blood cells carry oxygen and are used for blood loss replacement.  White blood cells fight against infection.  Platelets control bleeding.  Plasma helps clot blood.  Other blood products are available for specialized needs, such as hemophilia or other clotting disorders. BEFORE THE TRANSFUSION  Who gives blood for transfusions?   Healthy volunteers who are fully evaluated to make sure their blood is safe. This is blood bank blood. Transfusion therapy is the safest it has ever been in the practice of medicine. Before blood is taken from a donor, a complete history is taken to make sure  that person has no history of diseases nor engages in risky social behavior (examples are intravenous drug use or sexual activity with multiple partners). The donor's travel history is screened to minimize risk of transmitting infections, such as malaria. The donated blood is tested for signs of infectious diseases, such as HIV and hepatitis. The blood is then tested to be sure it is compatible with you in order to minimize the chance of a transfusion reaction. If you or a relative donates blood, this is often done in anticipation of surgery and is not appropriate for emergency situations. It takes many days to process the donated blood. RISKS AND COMPLICATIONS Although transfusion therapy is very safe and saves many lives, the main dangers of transfusion include:   Getting an infectious disease.  Developing a transfusion reaction. This is an allergic reaction to something in the blood you were given. Every precaution is  taken to prevent this. The decision to have a blood transfusion has been considered carefully by your caregiver before blood is given. Blood is not given unless the benefits outweigh the risks. AFTER THE TRANSFUSION  Right after receiving a blood transfusion, you will usually feel much better and more energetic. This is especially true if your red blood cells have gotten low (anemic). The transfusion raises the level of the red blood cells which carry oxygen, and this usually causes an energy increase.  The nurse administering the transfusion will monitor you carefully for complications. HOME CARE INSTRUCTIONS  No special instructions are needed after a transfusion. You may find your energy is better. Speak with your caregiver about any limitations on activity for underlying diseases you may have. SEEK MEDICAL CARE IF:   Your condition is not improving after your transfusion.  You develop redness or irritation at the intravenous (IV) site. SEEK IMMEDIATE MEDICAL CARE IF:  Any of  the following symptoms occur over the next 12 hours:  Shaking chills.  You have a temperature by mouth above 102 F (38.9 C), not controlled by medicine.  Chest, back, or muscle pain.  People around you feel you are not acting correctly or are confused.  Shortness of breath or difficulty breathing.  Dizziness and fainting.  You get a rash or develop hives.  You have a decrease in urine output.  Your urine turns a dark color or changes to pink, red, or brown. Any of the following symptoms occur over the next 10 days:  You have a temperature by mouth above 102 F (38.9 C), not controlled by medicine.  Shortness of breath.  Weakness after normal activity.  The white part of the eye turns yellow (jaundice).  You have a decrease in the amount of urine or are urinating less often.  Your urine turns a dark color or changes to pink, red, or brown. Document Released: 09/14/2000 Document Revised: 12/10/2011 Document Reviewed: 05/03/2008 ExitCare Patient Information 2014 ExitCare, [place].    ___________________________________Incentive Spirometer  An incentive spirometer is a tool that can help keep your lungs clear and active. This tool measures how well you are filling your lungs with each breath. Taking long deep breaths may help reverse or decrease the chance of developing breathing (pulmonary) problems (especially infection) following:  A long period of time when you are unable to move or be active. BEFORE THE PROCEDURE   If the spirometer includes an indicator to show your best effort, your nurse or respiratory therapist will set it to a desired goal.  If possible, sit up straight or lean slightly forward. Try not to slouch.  Hold the incentive spirometer in an upright position. INSTRUCTIONS FOR USE  1. Sit on the edge of your bed if possible, or sit up as far as you can in bed or on a chair. 2. Hold the incentive spirometer in an upright position. 3. Breathe out  normally. 4. Place the mouthpiece in your mouth and seal your lips tightly around it. 5. Breathe in slowly and as deeply as possible, raising the piston or the ball toward the top of the column. 6. Hold your breath for 3-5 seconds or for as long as possible. Allow the piston or ball to fall to the bottom of the column. 7. Remove the mouthpiece from your mouth and breathe out normally. 8. Rest for a few seconds and repeat Steps 1 through 7 at least 10 times every 1-2 hours when you are awake. Take your  time and take a few normal breaths between deep breaths. 9. The spirometer may include an indicator to show your best effort. Use the indicator as a goal to work toward during each repetition. 10. After each set of 10 deep breaths, practice coughing to be sure your lungs are clear. If you have an incision (the cut made at the time of surgery), support your incision when coughing by placing a pillow or rolled up towels firmly against it. Once you are able to get out of bed, walk around indoors and cough well. You may stop using the incentive spirometer when instructed by your caregiver.  RISKS AND COMPLICATIONS  Take your time so you do not get dizzy or light-headed.  If you are in pain, you may need to take or ask for pain medication before doing incentive spirometry. It is harder to take a deep breath if you are having pain. AFTER USE  Rest and breathe slowly and easily.  It can be helpful to keep track of a log of your progress. Your caregiver can provide you with a simple table to help with this. If you are using the spirometer at home, follow these instructions: SEEK MEDICAL CARE IF:   You are having difficultly using the spirometer.  You have trouble using the spirometer as often as instructed.  Your pain medication is not giving enough relief while using the spirometer.  You develop fever of 100.5 F (38.1 C) or higher. SEEK IMMEDIATE MEDICAL CARE IF:   You cough up bloody sputum  that had not been present before.  You develop fever of 102 F (38.9 C) or greater.  You develop worsening pain at or near the incision site. MAKE SURE YOU:   Understand these instructions.  Will watch your condition.  Will get help right away if you are not doing well or get worse. Document Released: 01/28/2007 Document Revised: 12/10/2011 Document Reviewed: 03/31/2007 ExitCare Patient Information 2014 [place].  ________________________________________________________________________                                                                                                                                                                   [facility] - Preparing for Surgery Before surgery, you can play an important role.  Because skin is not sterile, your skin needs to be as free of germs as possible.  You can reduce the number of germs on your skin by washing with CHG (chlorahexidine gluconate) soap before surgery.  CHG is an antiseptic cleaner which kills germs and bonds with the skin to continue killing germs even after washing. Please DO NOT use if you have an allergy to CHG or antibacterial soaps.  If your skin becomes reddened/irritated stop using the CHG and inform your nurse when  you arrive at Short Stay. Do not shave (including legs and underarms) for at least 48 hours prior to the first CHG shower.  You may shave your face/neck. Please follow these instructions carefully:  1.  Shower with CHG Soap the night before surgery and the  morning of Surgery.  2.  If you choose to wash your hair, wash your hair first as usual with your  normal  shampoo.  3.  After you shampoo, rinse your hair and body thoroughly to remove the  shampoo.                                       4.  Use CHG as you would any other liquid soap.  You can apply chg directly  to the skin and wash                       Gently with a scrungie or clean washcloth.  5.  Apply the CHG Soap to your body ONLY  FROM THE NECK DOWN.   Do not use on face/ open                           Wound or open sores. Avoid contact with eyes, ears mouth and genitals (private parts).                       Wash face,  Genitals (private parts) with your normal soap.             6.  Wash thoroughly, paying special attention to the area where your surgery  will be performed.  7.  Thoroughly rinse your body with warm water from the neck down.  8.  DO NOT shower/wash with your normal soap after using and rinsing off  the CHG Soap.             9.  Pat yourself dry with a clean towel.            10.  Wear clean pajamas.            11.  Place clean sheets on your bed the night of your first shower and do not  sleep with pets. Day of Surgery : Do not apply any lotions/deodorants the morning of surgery.  Please wear clean clothes to the hospital/surgery center.  FAILURE TO FOLLOW THESE INSTRUCTIONS MAY RESULT IN THE CANCELLATION OF YOUR SURGERY PATIENT SIGNATURE_________________________________  NURSE SIGNATURE__________________________________  ________________________________________________________________________

## 2018-06-10 ENCOUNTER — Encounter (HOSPITAL_COMMUNITY)
Admission: RE | Admit: 2018-06-10 | Discharge: 2018-06-10 | Disposition: A | Discharge status: Home / Self Care | Payer: PRIVATE HEALTH INSURANCE | Source: Ambulatory Visit | Attending: Orthopedic Surgery | Admitting: Orthopedic Surgery

## 2018-06-10 ENCOUNTER — Other Ambulatory Visit: Payer: Self-pay

## 2018-06-10 ENCOUNTER — Encounter (HOSPITAL_COMMUNITY): Payer: Self-pay

## 2018-06-10 HISTORY — DX: Unspecified osteoarthritis, unspecified site: M19.90

## 2018-06-10 HISTORY — DX: Presence of spectacles and contact lenses: Z97.3

## 2018-06-10 HISTORY — DX: Presence of external hearing-aid: Z97.4

## 2018-06-10 HISTORY — DX: Presence of dental prosthetic device (complete) (partial): Z97.2

## 2018-06-10 LAB — BASIC METABOLIC PANEL
Anion gap: 8 (ref 5–15)
BUN: 20 mg/dL (ref 8–23)
CHLORIDE: 107 mmol/L (ref 98–111)
CO2: 27 mmol/L (ref 22–32)
Calcium: 9.2 mg/dL (ref 8.9–10.3)
Creatinine, Ser: 0.9 mg/dL (ref 0.61–1.24)
GFR calc Af Amer: >60 mL/min (ref >60)
GFR calc non Af Amer: >60 mL/min (ref >60)
Glucose, Bld: 90 mg/dL (ref 70–99)
Potassium: 4.4 mmol/L (ref 3.5–5.1)
SODIUM: 142 mmol/L (ref 135–145)

## 2018-06-10 LAB — CBC
HCT: 43.6 % (ref 39.0–52.0)
HEMOGLOBIN: 14.7 g/dL (ref 13.0–17.0)
MCH: 31.2 pg (ref 26.0–34.0)
MCHC: 33.7 g/dL (ref 30.0–36.0)
MCV: 92.6 fL (ref 78.0–100.0)
Platelets: 216 10*3/uL (ref 150–400)
RBC: 4.71 MIL/uL (ref 4.22–5.81)
RDW: 13.2 % (ref 11.5–15.5)
WBC: 5.8 10*3/uL (ref 4.0–10.5)

## 2018-06-10 NOTE — Progress Notes (Signed)
CBC, BMP, T&S, PCR screen, and EKG pending results.

## 2018-06-10 NOTE — Anesthesia Preprocedure Evaluation (Addendum)
Anesthesia Evaluation  Patient identified by MRN, date of birth, ID band Patient awake    Reviewed: Allergy & Precautions, NPO status , Patient's Chart, lab work & pertinent test results  Airway Mallampati: II  TM Distance: >3 FB Neck ROM: Full    Dental no notable dental hx. (+) Partial Upper, Dental Advisory Given, Teeth Intact   Pulmonary former smoker,    Pulmonary exam normal breath sounds clear to auscultation       Cardiovascular hypertension, Pt. on medications negative cardio ROS Normal cardiovascular exam Rhythm:Regular Rate:Normal     Neuro/Psych negative neurological ROS  negative psych ROS   GI/Hepatic negative GI ROS, Neg liver ROS,   Endo/Other  negative endocrine ROS  Renal/GU negative Renal ROS  negative genitourinary   Musculoskeletal negative musculoskeletal ROS (+) Arthritis ,   Abdominal (+) + obese,   Peds negative pediatric ROS (+)  Hematology negative hematology ROS (+)   Anesthesia Other Findings   Reproductive/Obstetrics negative OB ROS                            Lab Results  Component Value Date   WBC 5.8 06/10/2018   HGB 14.7 06/10/2018   HCT 43.6 06/10/2018   MCV 92.6 06/10/2018   PLT 216 06/10/2018    Anesthesia Physical Anesthesia Plan  ASA: II  Anesthesia Plan: Spinal   Post-op Pain Management:    Induction:   PONV Risk Score and Plan: Treatment may vary due to age or medical condition  Airway Management Planned: Natural Airway and Nasal Cannula  Additional Equipment:   Intra-op Plan:   Post-operative Plan:   Informed Consent: I have reviewed the patients History and Physical, chart, labs and discussed the procedure including the risks, benefits and alternatives for the proposed anesthesia with the patient or authorized representative who has indicated his/her understanding and acceptance.   Dental advisory given  Plan Discussed  with: CRNA  Anesthesia Plan Comments:         Anesthesia Quick Evaluation

## 2018-06-11 ENCOUNTER — Encounter (HOSPITAL_COMMUNITY): Payer: Self-pay

## 2018-06-11 ENCOUNTER — Inpatient Hospital Stay (HOSPITAL_COMMUNITY): Payer: PRIVATE HEALTH INSURANCE

## 2018-06-11 ENCOUNTER — Encounter (HOSPITAL_COMMUNITY)
Admission: RE | Disposition: A | Discharge status: Home / Self Care | Payer: Self-pay | Source: Ambulatory Visit | Attending: Orthopedic Surgery

## 2018-06-11 ENCOUNTER — Inpatient Hospital Stay (HOSPITAL_COMMUNITY): Payer: PRIVATE HEALTH INSURANCE | Admitting: Certified Registered Nurse Anesthetist

## 2018-06-11 ENCOUNTER — Inpatient Hospital Stay (HOSPITAL_COMMUNITY)
Admission: RE | Admit: 2018-06-11 | Discharge: 2018-06-12 | DRG: 470 | Disposition: A | Discharge status: Home / Self Care | Payer: PRIVATE HEALTH INSURANCE | Source: Ambulatory Visit | Attending: Orthopedic Surgery | Admitting: Orthopedic Surgery

## 2018-06-11 DIAGNOSIS — I10 Essential (primary) hypertension: Secondary | ICD-10-CM | POA: Diagnosis present

## 2018-06-11 DIAGNOSIS — H9190 Unspecified hearing loss, unspecified ear: Secondary | ICD-10-CM | POA: Diagnosis present

## 2018-06-11 DIAGNOSIS — Z974 Presence of external hearing-aid: Secondary | ICD-10-CM | POA: Diagnosis not present

## 2018-06-11 DIAGNOSIS — Z6831 Body mass index (BMI) 31.0-31.9, adult: Secondary | ICD-10-CM

## 2018-06-11 DIAGNOSIS — Z96651 Presence of right artificial knee joint: Secondary | ICD-10-CM | POA: Diagnosis present

## 2018-06-11 DIAGNOSIS — M1712 Unilateral primary osteoarthritis, left knee: Principal | ICD-10-CM | POA: Diagnosis present

## 2018-06-11 DIAGNOSIS — Z79899 Other long term (current) drug therapy: Secondary | ICD-10-CM | POA: Diagnosis not present

## 2018-06-11 DIAGNOSIS — Z87891 Personal history of nicotine dependence: Secondary | ICD-10-CM

## 2018-06-11 DIAGNOSIS — Z7982 Long term (current) use of aspirin: Secondary | ICD-10-CM | POA: Diagnosis not present

## 2018-06-11 DIAGNOSIS — E669 Obesity, unspecified: Secondary | ICD-10-CM | POA: Diagnosis present

## 2018-06-11 DIAGNOSIS — M25562 Pain in left knee: Secondary | ICD-10-CM | POA: Diagnosis present

## 2018-06-11 DIAGNOSIS — K219 Gastro-esophageal reflux disease without esophagitis: Secondary | ICD-10-CM | POA: Diagnosis present

## 2018-06-11 DIAGNOSIS — Z96652 Presence of left artificial knee joint: Secondary | ICD-10-CM

## 2018-06-11 HISTORY — PX: KNEE ARTHROPLASTY: SHX992

## 2018-06-11 LAB — TYPE AND SCREEN
ABO/RH(D): O NEG
Antibody Screen: NEGATIVE

## 2018-06-11 LAB — GLUCOSE, CAPILLARY: GLUCOSE-CAPILLARY: 84 mg/dL (ref 70–99)

## 2018-06-11 SURGERY — ARTHROPLASTY, KNEE, TOTAL, USING IMAGELESS COMPUTER-ASSISTED NAVIGATION
Anesthesia: Spinal | Site: Knee | Laterality: Left

## 2018-06-11 MED ORDER — AMLODIPINE BESYLATE 10 MG PO TABS
10.0000 mg | ORAL_TABLET | Freq: Every morning | ORAL | Status: DC
  Administered 2018-06-11 – 2018-06-12 (×2): 10 mg via ORAL
  Filled 2018-06-11 (×2): qty 1

## 2018-06-11 MED ORDER — ACETAMINOPHEN 10 MG/ML IV SOLN
1000.0000 mg | INTRAVENOUS | Status: AC
  Administered 2018-06-11: 1000 mg via INTRAVENOUS
  Filled 2018-06-11 (×2): qty 100

## 2018-06-11 MED ORDER — HYDROMORPHONE HCL 1 MG/ML IJ SOLN
INTRAMUSCULAR | Status: AC
  Filled 2018-06-11: qty 1

## 2018-06-11 MED ORDER — CEFAZOLIN SODIUM-DEXTROSE 2-4 GM/100ML-% IV SOLN
2.0000 g | INTRAVENOUS | Status: AC
  Administered 2018-06-11: 2 g via INTRAVENOUS
  Filled 2018-06-11: qty 100

## 2018-06-11 MED ORDER — POVIDONE-IODINE 10 % EX SWAB
2.0000 "application " | Freq: Once | CUTANEOUS | Status: AC
  Administered 2018-06-11: 2 via TOPICAL

## 2018-06-11 MED ORDER — CEFAZOLIN SODIUM-DEXTROSE 2-4 GM/100ML-% IV SOLN
2.0000 g | Freq: Four times a day (QID) | INTRAVENOUS | Status: AC
  Administered 2018-06-11 (×2): 2 g via INTRAVENOUS
  Filled 2018-06-11 (×2): qty 100

## 2018-06-11 MED ORDER — TRANEXAMIC ACID 1000 MG/10ML IV SOLN
1000.0000 mg | INTRAVENOUS | Status: AC
  Administered 2018-06-11: 1000 mg via INTRAVENOUS
  Filled 2018-06-11 (×2): qty 10

## 2018-06-11 MED ORDER — ROPIVACAINE HCL 5 MG/ML IJ SOLN
INTRAMUSCULAR | Status: DC | PRN
  Administered 2018-06-11: 30 mL via PERINEURAL

## 2018-06-11 MED ORDER — EPHEDRINE SULFATE-NACL 50-0.9 MG/10ML-% IV SOSY
PREFILLED_SYRINGE | INTRAVENOUS | Status: DC | PRN
  Administered 2018-06-11 (×3): 5 mg via INTRAVENOUS

## 2018-06-11 MED ORDER — ACETAMINOPHEN 10 MG/ML IV SOLN: 1000.0000 mg | Freq: Once | INTRAVENOUS | Status: DC | PRN

## 2018-06-11 MED ORDER — METOCLOPRAMIDE HCL 5 MG PO TABS: 5.0000 mg | ORAL_TABLET | Freq: Three times a day (TID) | ORAL | Status: DC | PRN

## 2018-06-11 MED ORDER — PHENOL 1.4 % MT LIQD
1.0000 | OROMUCOSAL | Status: DC | PRN
  Filled 2018-06-11: qty 177

## 2018-06-11 MED ORDER — BUPIVACAINE HCL (PF) 0.25 % IJ SOLN
INTRAMUSCULAR | Status: AC
  Filled 2018-06-11: qty 30

## 2018-06-11 MED ORDER — PROPOFOL 500 MG/50ML IV EMUL
INTRAVENOUS | Status: DC | PRN
  Administered 2018-06-11: 50 ug/kg/min via INTRAVENOUS

## 2018-06-11 MED ORDER — PROPOFOL 10 MG/ML IV BOLUS
INTRAVENOUS | Status: DC | PRN
  Administered 2018-06-11 (×2): 10 mg via INTRAVENOUS
  Administered 2018-06-11 (×2): 20 mg via INTRAVENOUS
  Administered 2018-06-11: 10 mg via INTRAVENOUS

## 2018-06-11 MED ORDER — PROPOFOL 10 MG/ML IV BOLUS
INTRAVENOUS | Status: AC
  Filled 2018-06-11: qty 60

## 2018-06-11 MED ORDER — METHOCARBAMOL 500 MG PO TABS
500.0000 mg | ORAL_TABLET | Freq: Four times a day (QID) | ORAL | Status: DC | PRN
  Administered 2018-06-11 – 2018-06-12 (×2): 500 mg via ORAL
  Filled 2018-06-11 (×3): qty 1

## 2018-06-11 MED ORDER — METHOCARBAMOL 500 MG IVPB - SIMPLE MED
500.0000 mg | Freq: Four times a day (QID) | INTRAVENOUS | Status: DC | PRN
  Administered 2018-06-11: 500 mg via INTRAVENOUS
  Filled 2018-06-11: qty 50

## 2018-06-11 MED ORDER — HYDROCODONE-ACETAMINOPHEN 5-325 MG PO TABS: 1.0000 | ORAL_TABLET | ORAL | Status: DC | PRN

## 2018-06-11 MED ORDER — HYDROMORPHONE HCL 1 MG/ML IJ SOLN
0.2500 mg | INTRAMUSCULAR | Status: DC | PRN
  Administered 2018-06-11 (×2): 0.5 mg via INTRAVENOUS

## 2018-06-11 MED ORDER — DEXAMETHASONE SODIUM PHOSPHATE 10 MG/ML IJ SOLN
INTRAMUSCULAR | Status: DC | PRN
  Administered 2018-06-11: 10 mg via INTRAVENOUS

## 2018-06-11 MED ORDER — FENTANYL CITRATE (PF) 100 MCG/2ML IJ SOLN
INTRAMUSCULAR | Status: AC
  Administered 2018-06-11: 100 ug via INTRAVENOUS
  Filled 2018-06-11: qty 2

## 2018-06-11 MED ORDER — ASPIRIN 81 MG PO CHEW
81.0000 mg | CHEWABLE_TABLET | Freq: Two times a day (BID) | ORAL | Status: DC
  Administered 2018-06-11 – 2018-06-12 (×2): 81 mg via ORAL
  Filled 2018-06-11 (×2): qty 1

## 2018-06-11 MED ORDER — MENTHOL 3 MG MT LOZG: 1.0000 | LOZENGE | OROMUCOSAL | Status: DC | PRN

## 2018-06-11 MED ORDER — DEXAMETHASONE SODIUM PHOSPHATE 10 MG/ML IJ SOLN
10.0000 mg | Freq: Once | INTRAMUSCULAR | Status: AC
  Administered 2018-06-12: 10 mg via INTRAVENOUS
  Filled 2018-06-11: qty 1

## 2018-06-11 MED ORDER — PROPOFOL 10 MG/ML IV BOLUS
INTRAVENOUS | Status: AC
  Filled 2018-06-11: qty 40

## 2018-06-11 MED ORDER — DIPHENHYDRAMINE HCL 12.5 MG/5ML PO ELIX: 12.5000 mg | ORAL_SOLUTION | ORAL | Status: DC | PRN

## 2018-06-11 MED ORDER — ONDANSETRON HCL 4 MG PO TABS: 4.0000 mg | ORAL_TABLET | Freq: Four times a day (QID) | ORAL | Status: DC | PRN

## 2018-06-11 MED ORDER — POLYETHYLENE GLYCOL 3350 17 G PO PACK
17.0000 g | PACK | Freq: Every day | ORAL | Status: DC | PRN
  Administered 2018-06-12: 17 g via ORAL
  Filled 2018-06-11: qty 1

## 2018-06-11 MED ORDER — DOCUSATE SODIUM 100 MG PO CAPS
100.0000 mg | ORAL_CAPSULE | Freq: Two times a day (BID) | ORAL | Status: DC
  Administered 2018-06-11 – 2018-06-12 (×2): 100 mg via ORAL
  Filled 2018-06-11 (×2): qty 1

## 2018-06-11 MED ORDER — ACETAMINOPHEN 325 MG PO TABS: 325.0000 mg | ORAL_TABLET | Freq: Four times a day (QID) | ORAL | Status: DC | PRN

## 2018-06-11 MED ORDER — ONDANSETRON HCL 4 MG/2ML IJ SOLN
INTRAMUSCULAR | Status: DC | PRN
  Administered 2018-06-11: 4 mg via INTRAVENOUS

## 2018-06-11 MED ORDER — BUPIVACAINE HCL (PF) 0.25 % IJ SOLN
INTRAMUSCULAR | Status: DC | PRN
  Administered 2018-06-11: 30 mL

## 2018-06-11 MED ORDER — MEPERIDINE HCL 50 MG/ML IJ SOLN: 6.2500 mg | INTRAMUSCULAR | Status: DC | PRN

## 2018-06-11 MED ORDER — FENTANYL CITRATE (PF) 100 MCG/2ML IJ SOLN
50.0000 ug | INTRAMUSCULAR | Status: DC
  Administered 2018-06-11: 100 ug via INTRAVENOUS

## 2018-06-11 MED ORDER — MIDAZOLAM HCL 2 MG/2ML IJ SOLN
INTRAMUSCULAR | Status: AC
  Administered 2018-06-11: 1 mg via INTRAVENOUS
  Filled 2018-06-11: qty 2

## 2018-06-11 MED ORDER — KETOROLAC TROMETHAMINE 30 MG/ML IJ SOLN
INTRAMUSCULAR | Status: AC
  Filled 2018-06-11: qty 1

## 2018-06-11 MED ORDER — KETOROLAC TROMETHAMINE 30 MG/ML IJ SOLN
INTRAMUSCULAR | Status: DC | PRN
  Administered 2018-06-11: 30 mg via INTRA_ARTICULAR

## 2018-06-11 MED ORDER — EPHEDRINE 5 MG/ML INJ
INTRAVENOUS | Status: AC
  Filled 2018-06-11: qty 10

## 2018-06-11 MED ORDER — ONDANSETRON HCL 4 MG/2ML IJ SOLN
INTRAMUSCULAR | Status: AC
  Filled 2018-06-11: qty 2

## 2018-06-11 MED ORDER — PROPRANOLOL HCL 40 MG PO TABS
40.0000 mg | ORAL_TABLET | Freq: Every day | ORAL | Status: DC | PRN
  Filled 2018-06-11: qty 2

## 2018-06-11 MED ORDER — ISOPROPYL ALCOHOL 70 % SOLN
Status: AC
  Filled 2018-06-11: qty 480

## 2018-06-11 MED ORDER — KETOROLAC TROMETHAMINE 15 MG/ML IJ SOLN
7.5000 mg | Freq: Four times a day (QID) | INTRAMUSCULAR | Status: AC
  Administered 2018-06-11 – 2018-06-12 (×4): 7.5 mg via INTRAVENOUS
  Filled 2018-06-11 (×4): qty 1

## 2018-06-11 MED ORDER — ALUM & MAG HYDROXIDE-SIMETH 200-200-20 MG/5ML PO SUSP
30.0000 mL | ORAL | Status: DC | PRN
  Administered 2018-06-12: 30 mL via ORAL
  Filled 2018-06-11: qty 30

## 2018-06-11 MED ORDER — DEXAMETHASONE SODIUM PHOSPHATE 10 MG/ML IJ SOLN
INTRAMUSCULAR | Status: AC
  Filled 2018-06-11: qty 1

## 2018-06-11 MED ORDER — BUPIVACAINE-EPINEPHRINE (PF) 0.5% -1:200000 IJ SOLN
INTRAMUSCULAR | Status: AC
  Filled 2018-06-11: qty 30

## 2018-06-11 MED ORDER — SODIUM CHLORIDE 0.9 % IJ SOLN
INTRAMUSCULAR | Status: DC | PRN
  Administered 2018-06-11: 30 mL via INTRAVENOUS

## 2018-06-11 MED ORDER — MORPHINE SULFATE (PF) 4 MG/ML IV SOLN
0.5000 mg | INTRAVENOUS | Status: DC | PRN
  Administered 2018-06-11 – 2018-06-12 (×2): 1 mg via INTRAVENOUS
  Filled 2018-06-11 (×2): qty 1

## 2018-06-11 MED ORDER — SODIUM CHLORIDE 0.9 % IV SOLN
INTRAVENOUS | Status: DC
  Administered 2018-06-11 – 2018-06-12 (×3): via INTRAVENOUS

## 2018-06-11 MED ORDER — MIDAZOLAM HCL 2 MG/2ML IJ SOLN
1.0000 mg | INTRAMUSCULAR | Status: DC
  Administered 2018-06-11: 1 mg via INTRAVENOUS

## 2018-06-11 MED ORDER — SODIUM CHLORIDE 0.9 % IR SOLN
Status: DC | PRN
  Administered 2018-06-11 (×2): 1000 mL
  Administered 2018-06-11: 3000 mL

## 2018-06-11 MED ORDER — HYDROCODONE-ACETAMINOPHEN 7.5-325 MG PO TABS
1.0000 | ORAL_TABLET | ORAL | Status: DC | PRN
  Administered 2018-06-11: 2 via ORAL
  Administered 2018-06-11: 1 via ORAL
  Administered 2018-06-12 (×3): 2 via ORAL
  Filled 2018-06-11 (×3): qty 2
  Filled 2018-06-11: qty 1
  Filled 2018-06-11 (×2): qty 2

## 2018-06-11 MED ORDER — SODIUM CHLORIDE 0.9 % IJ SOLN
INTRAMUSCULAR | Status: AC
  Filled 2018-06-11: qty 50

## 2018-06-11 MED ORDER — LACTATED RINGERS IV SOLN
INTRAVENOUS | Status: DC
  Administered 2018-06-11: 1000 mL via INTRAVENOUS
  Administered 2018-06-11: 09:00:00 via INTRAVENOUS

## 2018-06-11 MED ORDER — CHLORHEXIDINE GLUCONATE 4 % EX LIQD: 60.0000 mL | Freq: Once | CUTANEOUS | Status: DC

## 2018-06-11 MED ORDER — SODIUM CHLORIDE 0.9 % IV SOLN: INTRAVENOUS | Status: DC

## 2018-06-11 MED ORDER — METHOCARBAMOL 500 MG IVPB - SIMPLE MED
INTRAVENOUS | Status: AC
  Filled 2018-06-11: qty 50

## 2018-06-11 MED ORDER — BUPIVACAINE IN DEXTROSE 0.75-8.25 % IT SOLN
INTRATHECAL | Status: DC | PRN
  Administered 2018-06-11: 1.8 mL via INTRATHECAL

## 2018-06-11 MED ORDER — HYDROCODONE-ACETAMINOPHEN 7.5-325 MG PO TABS: 1.0000 | ORAL_TABLET | Freq: Once | ORAL | Status: DC | PRN

## 2018-06-11 MED ORDER — METOCLOPRAMIDE HCL 5 MG/ML IJ SOLN: 5.0000 mg | Freq: Three times a day (TID) | INTRAMUSCULAR | Status: DC | PRN

## 2018-06-11 MED ORDER — PROMETHAZINE HCL 25 MG/ML IJ SOLN: 6.2500 mg | INTRAMUSCULAR | Status: DC | PRN

## 2018-06-11 MED ORDER — FOSINOPRIL SODIUM 20 MG PO TABS
40.0000 mg | ORAL_TABLET | Freq: Every morning | ORAL | Status: DC
  Filled 2018-06-11: qty 2

## 2018-06-11 MED ORDER — ONDANSETRON HCL 4 MG/2ML IJ SOLN: 4.0000 mg | Freq: Four times a day (QID) | INTRAMUSCULAR | Status: DC | PRN

## 2018-06-11 MED ORDER — STERILE WATER FOR IRRIGATION IR SOLN
Status: DC | PRN
  Administered 2018-06-11: 2000 mL

## 2018-06-11 SURGICAL SUPPLY — 68 items
ADH SKN CLS APL DERMABOND .7 (GAUZE/BANDAGES/DRESSINGS) ×1
BAG SPEC THK2 15X12 ZIP CLS (MISCELLANEOUS)
BAG ZIPLOCK 12X15 (MISCELLANEOUS) IMPLANT
BANDAGE ACE 4X5 VEL STRL LF (GAUZE/BANDAGES/DRESSINGS) ×3 IMPLANT
BANDAGE ACE 6X5 VEL STRL LF (GAUZE/BANDAGES/DRESSINGS) ×3 IMPLANT
BATTERY INSTRU NAVIGATION (MISCELLANEOUS) ×9 IMPLANT
BEARING TR TIB INST SZ7 11 KNE (Orthopedic Implant) IMPLANT
BLADE SAW RECIPROCATING 77.5 (BLADE) ×3 IMPLANT
BSPLAT TIB 7 KN TRITANIUM (Knees) ×1 IMPLANT
BTRY SRG DRVR LF (MISCELLANEOUS) ×3
CHLORAPREP W/TINT 26ML (MISCELLANEOUS) ×6 IMPLANT
COMP FEMORAL TRIATHLON LFT SZ7 (Orthopedic Implant) ×3 IMPLANT
COMP PATELLA TRIATHLON 40X11 (Stem) ×3 IMPLANT
COMPONENT FEMRL TRTHLON LT SZ7 (Orthopedic Implant) IMPLANT
COMPONENT PTLLA TRTHLON 40X11 (Stem) IMPLANT
COVER SURGICAL LIGHT HANDLE (MISCELLANEOUS) ×3 IMPLANT
CUFF TOURN SGL QUICK 34 (TOURNIQUET CUFF) ×3
CUFF TRNQT CYL 34X4X40X1 (TOURNIQUET CUFF) ×1 IMPLANT
DECANTER SPIKE VIAL GLASS SM (MISCELLANEOUS) ×6 IMPLANT
DERMABOND ADVANCED (GAUZE/BANDAGES/DRESSINGS) ×2
DERMABOND ADVANCED .7 DNX12 (GAUZE/BANDAGES/DRESSINGS) ×2 IMPLANT
DRAPE SHEET LG 3/4 BI-LAMINATE (DRAPES) ×8 IMPLANT
DRAPE U-SHAPE 47X51 STRL (DRAPES) ×3 IMPLANT
DRSG AQUACEL AG ADV 3.5X10 (GAUZE/BANDAGES/DRESSINGS) ×3 IMPLANT
DRSG TEGADERM 4X4.75 (GAUZE/BANDAGES/DRESSINGS) IMPLANT
ELECT BLADE TIP CTD 4 INCH (ELECTRODE) ×3 IMPLANT
ELECT REM PT RETURN 15FT ADLT (MISCELLANEOUS) ×3 IMPLANT
EVACUATOR 1/8 PVC DRAIN (DRAIN) IMPLANT
GAUZE SPONGE 4X4 12PLY STRL (GAUZE/BANDAGES/DRESSINGS) ×3 IMPLANT
GLOVE BIO SURGEON STRL SZ8.5 (GLOVE) ×6 IMPLANT
GLOVE BIOGEL PI IND STRL 8.5 (GLOVE) ×1 IMPLANT
GLOVE BIOGEL PI INDICATOR 8.5 (GLOVE) ×2
GOWN SPEC L3 XXLG W/TWL (GOWN DISPOSABLE) ×3 IMPLANT
HANDPIECE INTERPULSE COAX TIP (DISPOSABLE) ×3
HOOD PEEL AWAY FLYTE STAYCOOL (MISCELLANEOUS) ×9 IMPLANT
KNEE TIBIAL COMPONENT SZ7 (Knees) ×2 IMPLANT
MARKER SKIN DUAL TIP RULER LAB (MISCELLANEOUS) ×3 IMPLANT
NDL SPNL 18GX3.5 QUINCKE PK (NEEDLE) ×1 IMPLANT
NEEDLE SPNL 18GX3.5 QUINCKE PK (NEEDLE) ×3 IMPLANT
NS IRRIG 1000ML POUR BTL (IV SOLUTION) ×3 IMPLANT
PACK TOTAL KNEE CUSTOM (KITS) ×3 IMPLANT
PADDING CAST ABS 6INX4YD NS (CAST SUPPLIES) ×2
PADDING CAST ABS COTTON 6X4 NS (CAST SUPPLIES) IMPLANT
PADDING CAST COTTON 6X4 STRL (CAST SUPPLIES) ×3 IMPLANT
POSITIONER SURGICAL ARM (MISCELLANEOUS) ×3 IMPLANT
SAW OSC TIP CART 19.5X105X1.3 (SAW) ×3 IMPLANT
SEALER BIPOLAR AQUA 6.0 (INSTRUMENTS) ×3 IMPLANT
SET HNDPC FAN SPRY TIP SCT (DISPOSABLE) ×1 IMPLANT
SET PAD KNEE POSITIONER (MISCELLANEOUS) ×3 IMPLANT
SPONGE DRAIN TRACH 4X4 STRL 2S (GAUZE/BANDAGES/DRESSINGS) IMPLANT
SPONGE LAP 18X18 RF (DISPOSABLE) IMPLANT
SUT MNCRL AB 3-0 PS2 18 (SUTURE) ×3 IMPLANT
SUT MON AB 2-0 CT1 36 (SUTURE) ×6 IMPLANT
SUT STRATAFIX PDO 1 14 VIOLET (SUTURE) ×3
SUT STRATFX PDO 1 14 VIOLET (SUTURE) ×1
SUT VIC AB 1 CTX 36 (SUTURE) ×6
SUT VIC AB 1 CTX36XBRD ANBCTR (SUTURE) ×2 IMPLANT
SUT VIC AB 2-0 CT1 27 (SUTURE) ×3
SUT VIC AB 2-0 CT1 TAPERPNT 27 (SUTURE) ×1 IMPLANT
SUTURE STRATFX PDO 1 14 VIOLET (SUTURE) ×1 IMPLANT
SYR 50ML LL SCALE MARK (SYRINGE) ×3 IMPLANT
TOWER CARTRIDGE SMART MIX (DISPOSABLE) IMPLANT
TRAY FOLEY MTR SLVR 16FR STAT (SET/KITS/TRAYS/PACK) IMPLANT
TRI TIB BEAR INSERT SZ 7 KNEE (Orthopedic Implant) ×3 IMPLANT
TRI TIB BEAR INST SZ 7 11 KNEE (Orthopedic Implant) ×1 IMPLANT
WATER STERILE IRR 1000ML POUR (IV SOLUTION) ×6 IMPLANT
WRAP KNEE MAXI GEL POST OP (GAUZE/BANDAGES/DRESSINGS) ×3 IMPLANT
YANKAUER SUCT BULB TIP 10FT TU (MISCELLANEOUS) ×3 IMPLANT

## 2018-06-11 NOTE — Anesthesia Procedure Notes (Signed)
Anesthesia Regional Block: Adductor canal block   Pre-Anesthetic Checklist: ,, timeout performed, Correct Patient, Correct Site, Correct Laterality, Correct Procedure, Correct Position, site marked, Risks and benefits discussed,  Surgical consent,  Pre-op evaluation,  At surgeon's request and post-op pain management  Laterality: Left  Prep: Maximum Sterile Barrier Precautions used, chloraprep       Needles:  Injection technique: Single-shot  Needle Type: Echogenic Needle     Needle Length: 9cm  Needle Gauge: 21     Additional Needles:   Procedures:,,,, ultrasound used (permanent image in chart),,,,  Narrative:  Start time: 06/11/2018 8:06 AM End time: 06/11/2018 8:12 AM Injection made incrementally with aspirations every 5 mL.  Performed by: Personally  Anesthesiologist: Trevor Iha, MD

## 2018-06-11 NOTE — Transfer of Care (Signed)
Immediate Anesthesia Transfer of Care Note  Patient: Ricky Bonilla  Procedure(s) Performed: LEFT  TOTAL KNEE ARTHROPLASTY WITH COMPUTER NAVIGATION (Left Knee)  Patient Location: PACU  Anesthesia Type:Spinal  Level of Consciousness: drowsy and patient cooperative  Airway & Oxygen Therapy: Patient Spontanous Breathing and Patient connected to face mask oxygen  Post-op Assessment: Report given to RN and Post -op Vital signs reviewed and stable  Post vital signs: Reviewed and stable  Last Vitals:  Vitals Value Taken Time  BP 125/76 06/11/2018 11:30 AM  Temp    Pulse 59 06/11/2018 11:33 AM  Resp 13 06/11/2018 11:33 AM  SpO2 100 % 06/11/2018 11:33 AM  Vitals shown include unvalidated device data.  Last Pain:  Vitals:   06/11/18 0826  TempSrc:   PainSc: 0-No pain      Patients Stated Pain Goal: 4 (06/11/18 0645)  Complications: No apparent anesthesia complications

## 2018-06-11 NOTE — Anesthesia Postprocedure Evaluation (Signed)
Anesthesia Post Note  Patient: Ricky Bonilla  Procedure(s) Performed: LEFT  TOTAL KNEE ARTHROPLASTY WITH COMPUTER NAVIGATION (Left Knee)     Patient location during evaluation: PACU Anesthesia Type: Spinal Level of consciousness: oriented and awake and alert Pain management: pain level controlled Vital Signs Assessment: post-procedure vital signs reviewed and stable Respiratory status: spontaneous breathing, respiratory function stable and patient connected to nasal cannula oxygen Cardiovascular status: blood pressure returned to baseline and stable Postop Assessment: no headache, no backache and no apparent nausea or vomiting Anesthetic complications: no    Last Vitals:  Vitals:   06/11/18 1434 06/11/18 1535  BP: 130/80 133/81  Pulse: 66 79  Resp: 15 15  Temp:  (!) 36.3 C  SpO2: 96% 95%    Last Pain:  Vitals:   06/11/18 1535  TempSrc: Oral  PainSc:                  Trevor Iha

## 2018-06-11 NOTE — Op Note (Signed)
OPERATIVE REPORT  SURGEON: Rod Can, MD   ASSISTANT: Nehemiah Massed, PA-C.  PREOPERATIVE DIAGNOSIS: Left knee arthritis.   POSTOPERATIVE DIAGNOSIS: Left knee arthritis.   PROCEDURE: Left total knee arthroplasty.   IMPLANTS: Stryker Triathlon CR femur, size 7. Stryker Tritanium tibia, size 7. X3 polyethelyene insert, size 11 mm, CR. 3 button asymmetric patella, size 40 mm.  ANESTHESIA:  MAC, Regional and Spinal  TOURNIQUET TIME: Not utilized.   ESTIMATED BLOOD LOSS:-300 mL    ANTIBIOTICS: 2 g Ancef.  DRAINS: None.  COMPLICATIONS: None   CONDITION: PACU - hemodynamically stable.   BRIEF CLINICAL NOTE: Ricky Bonilla is a 67 y.o. male with a long-standing history of Left knee arthritis. After failing conservative management, the patient was indicated for total knee arthroplasty. The risks, benefits, and alternatives to the procedure were explained, and the patient elected to proceed.  PROCEDURE IN DETAIL: Adductor canal block was obtained in the pre-op holding area. Once inside the operative room, spinal anesthesia was obtained, and a foley catheter was inserted. The patient was then positioned, a nonsterile tourniquet was placed, and the lower extremity was prepped and draped in the normal sterile surgical fashion. A time-out was called verifying side and site of surgery. The patient received IV antibiotics within 60 minutes of beginning the procedure. The tourniquet was not utilized.  An anterior approach to the knee was performed utilizing a midvastus arthrotomy. A medial release was performed and the patellar fat pad was excised. Stryker navigation was used to cut the distal femur perpendicular to the mechanical axis. A freehand patellar resection was performed, and the patella was sized an prepared with 3 lug holes.  Nagivation was used to make a neutral proximal tibia  resection, taking 9 mm of bone from the less affected lateral side with 3 degrees of slope. The menisci were excised. A spacer block was placed, and the alignment and balance in extension were confirmed.   The distal femur was sized using the 3-degree external rotation guide referencing the posterior femoral cortex. The appropriate 4-in-1 cutting block was pinned into place. Rotation was checked using Whiteside's line, the epicondylar axis, and then confirmed with a spacer block in flexion. The remaining femoral cuts were performed, taking care to protect the MCL.  The tibia was sized and the trial tray was pinned into place. The remaining trail components were inserted. The knee was stable to varus and valgus stress through a full range of motion. The patella tracked centrally, and the PCL was well balanced. The trial components were removed, and the proximal tibial surface was prepared. Final components were impacted into place. The knee was tested for a final time and found to be well balanced.  The wound was copiously irrigated with normal saline with pulse lavage. Marcaine solution was injected into the periarticular soft tissue. The wound was closed in layers using #1 Vicryl and Stratafix for the fascia, 2-0 Vicryl for the subcutaneous fat, 2-0 Monocryl for the deep dermal layer, 3-0 running Monocryl subcuticular Stitch, and Dermabond for the skin. Once the glue was fully dried, an Aquacell Ag and compressive dressing were applied. Tthe patient was transported to the recovery room in stable condition. Sponge, needle, and instrument counts were correct at the end of the case x2. The patient tolerated the procedure well and there were no known complications.  Please note that a surgical assistant was a medical necessity for this procedure in order to perform it in a safe and expeditious manner. Surgical assistant was  necessary to retract the ligaments and vital neurovascular structures to prevent  injury to them and also necessary for proper positioning of the limb to allow for anatomic placement of the prosthesis.

## 2018-06-11 NOTE — Discharge Instructions (Signed)
° °Dr. Zyliah Schier °Total Joint Specialist °[facility] °[address] °[place], [place] [address] °[phone] ° °TOTAL KNEE REPLACEMENT POSTOPERATIVE DIRECTIONS ° ° ° °Knee Rehabilitation, Guidelines Following Surgery  °Results after knee surgery are often greatly improved when you follow the exercise, range of motion and muscle strengthening exercises prescribed by your doctor. Safety measures are also important to protect the knee from further injury. Any time any of these exercises cause you to have increased pain or swelling in your knee joint, decrease the amount until you are comfortable again and slowly increase them. If you have problems or questions, call your caregiver or physical therapist for advice.  ° °WEIGHT BEARING °Weight bearing as tolerated with assist device (walker, cane, etc) as directed, use it as long as suggested by your surgeon or therapist, typically at least 4-6 weeks. ° °HOME CARE INSTRUCTIONS  °Remove items at home which could result in a fall. This includes throw rugs or furniture in walking pathways.  °Continue medications as instructed at time of discharge. °You may have some home medications which will be placed on hold until you complete the course of blood thinner medication.  °You may start showering once you are discharged home but do not submerge the incision under water. Just pat the incision dry and apply a dry gauze dressing on daily. °Walk with walker as instructed.  °You may resume a sexual relationship in one month or when given the OK by your doctor.  °· Use walker as long as suggested by your caregivers. °· Avoid periods of inactivity such as sitting longer than an hour when not asleep. This helps prevent blood clots.  °You may put full weight on your legs and walk as much as is comfortable.  °You may return to work once you are cleared by your doctor.  °Do not drive a car for 6 weeks or until released by you surgeon.  °· Do not drive  while taking narcotics.  °Wear the elastic stockings for three weeks following surgery during the day but you may remove then at night. °Make sure you keep all of your appointments after your operation with all of your doctors and caregivers. You should call the office at the above phone number and make an appointment for approximately two weeks after the date of your surgery. °Do not remove your surgical dressing. The dressing is waterproof; you may take showers in 3 days, but do not take tub baths or submerge the dressing. °Please pick up a stool softener and laxative for home use as long as you are requiring pain medications. °· ICE to the affected knee every three hours for 30 minutes at a time and then as needed for pain and swelling.  Continue to use ice on the knee for pain and swelling from surgery. You may notice swelling that will progress down to the foot and ankle.  This is normal after surgery.  Elevate the leg when you are not up walking on it.   °It is important for you to complete the blood thinner medication as prescribed by your doctor. °· Continue to use the breathing machine which will help keep your temperature down.  It is common for your temperature to cycle up and down following surgery, especially at night when you are not up moving around and exerting yourself.  The breathing machine keeps your lungs expanded and your temperature down. ° °RANGE OF MOTION AND STRENGTHENING EXERCISES  °Rehabilitation of the knee is important following   a knee injury or an operation. After just a few days of immobilization, the muscles of the thigh which control the knee become weakened and shrink (atrophy). Knee exercises are designed to build up the tone and strength of the thigh muscles and to improve knee motion. Often times heat used for twenty to thirty minutes before working out will loosen up your tissues and help with improving the range of motion but do not use heat for the first two weeks following  surgery. These exercises can be done on a training (exercise) mat, on the floor, on a table or on a bed. Use what ever works the best and is most comfortable for you Knee exercises include:  °Leg Lifts - While your knee is still immobilized in a splint or cast, you can do straight leg raises. Lift the leg to 60 degrees, hold for 3 sec, and slowly lower the leg. Repeat 10-20 times 2-3 times daily. Perform this exercise against resistance later as your knee gets better.  °Quad and Hamstring Sets - Tighten up the muscle on the front of the thigh (Quad) and hold for 5-10 sec. Repeat this 10-20 times hourly. Hamstring sets are done by pushing the foot backward against an object and holding for 5-10 sec. Repeat as with quad sets.  °A rehabilitation program following serious knee injuries can speed recovery and prevent re-injury in the future due to weakened muscles. Contact your doctor or a physical therapist for more information on knee rehabilitation.  ° °SKILLED REHAB INSTRUCTIONS: °If the patient is transferred to a skilled rehab facility following release from the hospital, a list of the current medications will be sent to the facility for the patient to continue.  When discharged from the skilled rehab facility, please have the facility set up the patient's Home Health Physical Therapy prior to being released. Also, the skilled facility will be responsible for providing the patient with their medications at time of release from the facility to include their pain medication, the muscle relaxants, and their blood thinner medication. If the patient is still at the rehab facility at time of the two week follow up appointment, the skilled rehab facility will also need to assist the patient in arranging follow up appointment in our office and any transportation needs. ° °MAKE SURE YOU:  °Understand these instructions.  °Will watch your condition.  °Will get help right away if you are not doing well or get worse.   ° ° °Pick up stool softner and laxative for home use following surgery while on pain medications. °Do NOT remove your dressing. You may shower.  °Do not take tub baths or submerge incision under water. °May shower starting three days after surgery. °Please use a clean towel to pat the incision dry following showers. °Continue to use ice for pain and swelling after surgery. °Do not use any lotions or creams on the incision until instructed by your surgeon. ° °

## 2018-06-11 NOTE — Anesthesia Procedure Notes (Signed)
Spinal  Start time: 06/11/2018 8:39 AM End time: 06/11/2018 8:42 AM Staffing Anesthesiologist: Trevor Iha, MD Resident/CRNA: Epimenio Sarin, CRNA Performed: resident/CRNA  Preanesthetic Checklist Completed: patient identified, surgical consent, pre-op evaluation, timeout performed, IV checked, risks and benefits discussed and monitors and equipment checked Spinal Block Patient position: sitting Prep: DuraPrep Patient monitoring: cardiac monitor, heart rate, continuous pulse ox and blood pressure Approach: midline Location: L3-4 Injection technique: single-shot Needle Needle type: Pencan  Needle gauge: 24 G Needle length: 10 cm Needle insertion depth: 7.5 cm Assessment Sensory level: T6 Additional Notes Lot [id number] exp 2019-07-01. Timeout performed. Positive CSF, negative heme/parasthesia. Patient tolerated well.

## 2018-06-11 NOTE — Progress Notes (Signed)
AssistedDr. Houser with left, ultrasound guided, adductor canal block. Side rails up, monitors on throughout procedure. See vital signs in flow sheet. Tolerated Procedure well.  

## 2018-06-11 NOTE — Interval H&P Note (Signed)
History and Physical Interval Note:  06/11/2018 7:47 AM  Ricky Bonilla  has presented today for surgery, with the diagnosis of Degenerative joint disease left knee  The various methods of treatment have been discussed with the patient and family. After consideration of risks, benefits and other options for treatment, the patient has consented to  Procedure(s) with comments: LEFT  TOTAL KNEE ARTHROPLASTY WITH COMPUTER NAVIGATION (Left) - Needs RNFA as a surgical intervention .  The patient's history has been reviewed, patient examined, no change in status, stable for surgery.  I have reviewed the patient's chart and labs.  Questions were answered to the patient's satisfaction.     [name]

## 2018-06-11 NOTE — Evaluation (Signed)
Physical Therapy Evaluation Patient Details Name: Ricky Bonilla MRN: [id number] DOB: Jun 22, 1951 Today's Date: 06/11/2018   History of Present Illness  Pt s/p L TKR and with hx of R TKR 12/17  Clinical Impression  Pt s/p L TKR and presents with decreased L LE strength/ROM and post op pain limiting functional mobility.  Pt should progress to dc home with family assist.    Follow Up Recommendations Follow surgeon's recommendation for DC plan and follow-up therapies    Equipment Recommendations  None recommended by PT    Recommendations for Other Services       Precautions / Restrictions Precautions Precautions: Knee;Fall Restrictions Weight Bearing Restrictions: No Other Position/Activity Restrictions: WBAT      Mobility  Bed Mobility Overal bed mobility: Needs Assistance Bed Mobility: Supine to Sit     Supine to sit: Min assist     General bed mobility comments: cues for sequence and use of R LE to self assist  Transfers Overall transfer level: Needs assistance   Transfers: Sit to/from Stand Sit to Stand: Min assist         General transfer comment: cues for LE management and use of UEs to self assist  Ambulation/Gait Ambulation/Gait assistance: Min assist Gait Distance (Feet): 60 Feet Assistive device: Rolling walker (2 wheeled) Gait Pattern/deviations: Step-to pattern;Decreased step length - right;Decreased step length - left;Shuffle;Trunk flexed Gait velocity: decr   General Gait Details: cues for sequence, posture and position from [employer]            Wheelchair Mobility    Modified Rankin (Stroke Patients Only)       Balance Overall balance assessment: Mild deficits observed, not formally tested                                           Pertinent Vitals/Pain Pain Assessment: 0-10 Pain Score: 5  Pain Location: L knee Pain Descriptors / Indicators: Aching;Sore Pain Intervention(s): Limited activity within patient's  tolerance;Monitored during session;Premedicated before session;Ice applied    Home Living Family/patient expects to be discharged to:: Private residence Living Arrangements: Spouse/significant other Available Help at Discharge: Family Type of Home: House Home Access: Stairs to enter Entrance Stairs-Rails: Right Entrance Stairs-Number of Steps: 6 Home Layout: One level Home Equipment: Environmental consultant - 2 wheels      Prior Function Level of Independence: Independent               Hand Dominance        Extremity/Trunk Assessment   Upper Extremity Assessment Upper Extremity Assessment: Overall WFL for tasks assessed    Lower Extremity Assessment Lower Extremity Assessment: LLE deficits/detail    Cervical / Trunk Assessment Cervical / Trunk Assessment: Normal  Communication   Communication: No difficulties  Cognition Arousal/Alertness: Awake/alert Behavior During Therapy: WFL for tasks assessed/performed Overall Cognitive Status: Within Functional Limits for tasks assessed                                        General Comments      Exercises Total Joint Exercises Ankle Circles/Pumps: AROM;Both;15 reps;Supine   Assessment/Plan    PT Assessment Patient needs continued PT services  PT Problem List Decreased strength;Decreased range of motion;Decreased activity tolerance;Decreased mobility;Decreased knowledge of use of DME;Pain;Obesity  PT Treatment Interventions DME instruction;Gait training;Stair training;Functional mobility training;Therapeutic activities;Therapeutic exercise;Patient/family education    PT Goals (Current goals can be found in the Care Plan section)  Acute Rehab PT Goals Patient Stated Goal: Regain IND PT Goal Formulation: With patient Time For Goal Achievement: 06/18/18 Potential to Achieve Goals: Good    Frequency 7X/week   Barriers to discharge        Co-evaluation               AM-PAC PT "6 Clicks" Daily  Activity  Outcome Measure Difficulty turning over in bed (including adjusting bedclothes, sheets and blankets)?: Unable Difficulty moving from lying on back to sitting on the side of the bed? : Unable Difficulty sitting down on and standing up from a chair with arms (e.g., wheelchair, bedside commode, etc,.)?: Unable Help needed moving to and from a bed to chair (including a wheelchair)?: A Little Help needed walking in hospital room?: A Little Help needed climbing 3-5 steps with a railing? : A Little 6 Click Score: 12    End of Session Equipment Utilized During Treatment: Gait belt Activity Tolerance: Patient tolerated treatment well Patient left: in chair;with call bell/phone within reach;with family/visitor present Nurse Communication: Mobility status PT Visit Diagnosis: Difficulty in walking, not elsewhere classified (R26.2)    Time: 1062-6948 PT Time Calculation (min) (ACUTE ONLY): 23 min   Charges:   PT Evaluation $PT Eval Low Complexity: 1 Low PT Treatments $Gait Training: 8-22 mins        Mauro Kaufmann PT Acute Rehabilitation Services Pager [phone] Office [phone]   Ricky Bonilla 06/11/2018, 4:28 PM

## 2018-06-12 ENCOUNTER — Encounter (HOSPITAL_COMMUNITY): Payer: Self-pay | Admitting: Orthopedic Surgery

## 2018-06-12 LAB — CBC
HCT: 36 % — ABNORMAL LOW (ref 39.0–52.0)
Hemoglobin: 12.4 g/dL — ABNORMAL LOW (ref 13.0–17.0)
MCH: 31.4 pg (ref 26.0–34.0)
MCHC: 34.4 g/dL (ref 30.0–36.0)
MCV: 91.1 fL (ref 78.0–100.0)
PLATELETS: 208 10*3/uL (ref 150–400)
RBC: 3.95 MIL/uL — ABNORMAL LOW (ref 4.22–5.81)
RDW: 13.3 % (ref 11.5–15.5)
WBC: 12.8 10*3/uL — ABNORMAL HIGH (ref 4.0–10.5)

## 2018-06-12 LAB — BASIC METABOLIC PANEL
Anion gap: 8 (ref 5–15)
BUN: 18 mg/dL (ref 8–23)
CO2: 25 mmol/L (ref 22–32)
CREATININE: 0.86 mg/dL (ref 0.61–1.24)
Calcium: 8.7 mg/dL — ABNORMAL LOW (ref 8.9–10.3)
Chloride: 109 mmol/L (ref 98–111)
GFR calc Af Amer: >60 mL/min (ref >60)
Glucose, Bld: 127 mg/dL — ABNORMAL HIGH (ref 70–99)
POTASSIUM: 4.2 mmol/L (ref 3.5–5.1)
SODIUM: 142 mmol/L (ref 135–145)

## 2018-06-12 MED ORDER — HYDROCODONE-ACETAMINOPHEN 5-325 MG PO TABS: 1.0000 | ORAL_TABLET | Freq: Four times a day (QID) | ORAL | 0 refills | Status: DC | PRN

## 2018-06-12 MED ORDER — DOCUSATE SODIUM 100 MG PO CAPS: 100.0000 mg | ORAL_CAPSULE | Freq: Two times a day (BID) | ORAL | 1 refills | Status: AC

## 2018-06-12 MED ORDER — ASPIRIN 81 MG PO CHEW: 81.0000 mg | CHEWABLE_TABLET | Freq: Two times a day (BID) | ORAL | 1 refills | Status: AC

## 2018-06-12 MED ORDER — ONDANSETRON HCL 4 MG PO TABS: 4.0000 mg | ORAL_TABLET | Freq: Four times a day (QID) | ORAL | 0 refills | Status: AC | PRN

## 2018-06-12 MED ORDER — LISINOPRIL 20 MG PO TABS
40.0000 mg | ORAL_TABLET | Freq: Every day | ORAL | Status: DC
  Administered 2018-06-12: 40 mg via ORAL
  Filled 2018-06-12: qty 2

## 2018-06-12 NOTE — Care Management Note (Signed)
Case Management Note  Patient Details  Name: Ricky Bonilla MRN: [id number] Date of Birth: 07/14/1951  Subjective/Objective: From home w/spouse. PT-recc f/u surgeon's recc-Otpt PT. No further CM needs.                   Action/Plan:d/c home    Expected Discharge Date:  06/12/18               Expected Discharge Plan:  OP Rehab  In-House Referral:     Discharge planning Services  CM Consult  Post Acute Care Choice:  (rw) Choice offered to:     DME Arranged:    DME Agency:     HH Arranged:    HH Agency:     Status of Service:  Completed, signed off  If discussed at [employer] Length of Stay Meetings, dates discussed:    Additional Comments:  Lanier ClamMahabir, Madalen Gavin, RN 06/12/2018, 11:22 AM

## 2018-06-12 NOTE — Progress Notes (Signed)
Physical Therapy Treatment Patient Details Name: Ricky Bonilla MRN: [id number] DOB: 06/30/1951 Today's Date: 06/12/2018    History of Present Illness Pt s/p L TKR and with hx of R TKR 12/17    PT Comments    Pt cooperative and progressing well with mobility.  Initiated therex program.  Pt and spouse working on OP PT appt for follow up.  Will follow up with stairs and home therex program this pm.   Follow Up Recommendations  Follow surgeon's recommendation for DC plan and follow-up therapies     Equipment Recommendations  None recommended by PT    Recommendations for Other Services Rehab consult     Precautions / Restrictions Precautions Precautions: Knee;Fall Restrictions Weight Bearing Restrictions: No Other Position/Activity Restrictions: WBAT    Mobility  Bed Mobility Overal bed mobility: Needs Assistance Bed Mobility: Supine to Sit     Supine to sit: Min guard     General bed mobility comments: cues for sequence and use of R LE to self assist  Transfers Overall transfer level: Needs assistance Equipment used: Rolling walker (2 wheeled) Transfers: Sit to/from Stand Sit to Stand: Min guard         General transfer comment: cues for LE management and use of UEs to self assist  Ambulation/Gait Ambulation/Gait assistance: Min guard Gait Distance (Feet): 148 Feet Assistive device: Rolling walker (2 wheeled) Gait Pattern/deviations: Decreased step length - right;Decreased step length - left;Shuffle;Trunk flexed;Step-to pattern;Step-through pattern Gait velocity: decr   General Gait Details: cues for sequence, posture and position from [employer]   Stairs             Wheelchair Mobility    Modified Rankin (Stroke Patients Only)       Balance Overall balance assessment: Mild deficits observed, not formally tested                                          Cognition Arousal/Alertness: Awake/alert Behavior During Therapy: WFL for  tasks assessed/performed Overall Cognitive Status: Within Functional Limits for tasks assessed                                        Exercises Total Joint Exercises Ankle Circles/Pumps: AROM;Both;15 reps;Supine Quad Sets: AROM;Both;10 reps;Supine Heel Slides: AAROM;Left;15 reps;Supine Straight Leg Raises: AAROM;AROM;Left;15 reps;Supine Goniometric ROM: L knee AAROM -10 - 85    General Comments        Pertinent Vitals/Pain Pain Assessment: 0-10 Pain Score: 6  Pain Location: L knee Pain Descriptors / Indicators: Aching;Sore Pain Intervention(s): Limited activity within patient's tolerance;Monitored during session;Premedicated before session;Ice applied    Home Living                      Prior Function            PT Goals (current goals can now be found in the care plan section) Acute Rehab PT Goals Patient Stated Goal: Regain IND PT Goal Formulation: With patient Time For Goal Achievement: 06/18/18 Potential to Achieve Goals: Good Progress towards PT goals: Progressing toward goals    Frequency    7X/week      PT Plan Current plan remains appropriate    Co-evaluation              AM-PAC PT "  6 Clicks" Daily Activity  Outcome Measure  Difficulty turning over in bed (including adjusting bedclothes, sheets and blankets)?: A Lot Difficulty moving from lying on back to sitting on the side of the bed? : A Lot Difficulty sitting down on and standing up from a chair with arms (e.g., wheelchair, bedside commode, etc,.)?: A Lot Help needed moving to and from a bed to chair (including a wheelchair)?: A Little Help needed walking in hospital room?: A Little Help needed climbing 3-5 steps with a railing? : A Little 6 Click Score: 15    End of Session Equipment Utilized During Treatment: Gait belt Activity Tolerance: Patient tolerated treatment well Patient left: in chair;with call bell/phone within reach;with family/visitor  present Nurse Communication: Mobility status PT Visit Diagnosis: Difficulty in walking, not elsewhere classified (R26.2)     Time: 5621-3086 PT Time Calculation (min) (ACUTE ONLY): 34 min  Charges:  $Gait Training: 8-22 mins $Therapeutic Exercise: 8-22 mins                     Ricky Bonilla PT Acute Rehabilitation Services Pager [phone] Office [phone]    Ricky Bonilla 06/12/2018, 11:31 AM

## 2018-06-12 NOTE — Progress Notes (Signed)
Physical Therapy Treatment Patient Details Name: Ricky Bonilla MRN: [id number] DOB: 09/16/1951 Today's Date: 06/12/2018    History of Present Illness Pt s/p L TKR and with hx of R TKR 12/17    PT Comments    Pt progressing well with mobility and eager for dc home.  Spouse present and reviewed stairs and home therex with written instruction provided.     Follow Up Recommendations  Follow surgeon's recommendation for DC plan and follow-up therapies     Equipment Recommendations  None recommended by PT    Recommendations for Other Services Rehab consult     Precautions / Restrictions Precautions Precautions: Knee;Fall Restrictions Weight Bearing Restrictions: No Other Position/Activity Restrictions: WBAT    Mobility  Bed Mobility Overal bed mobility: Needs Assistance Bed Mobility: Supine to Sit;Sit to Supine     Supine to sit: Supervision Sit to supine: Supervision   General bed mobility comments: cues for sequence and use of R LE to self assist  Transfers Overall transfer level: Needs assistance Equipment used: Rolling walker (2 wheeled) Transfers: Sit to/from Stand Sit to Stand: Min guard;Supervision         General transfer comment: cues for LE management and use of UEs to self assist  Ambulation/Gait Ambulation/Gait assistance: Min guard;Supervision Gait Distance (Feet): 250 Feet Assistive device: Rolling walker (2 wheeled) Gait Pattern/deviations: Decreased step length - right;Decreased step length - left;Shuffle;Trunk flexed;Step-to pattern;Step-through pattern Gait velocity: decr   General Gait Details: cues for sequence, posture and position from RW   Stairs Stairs: Yes Stairs assistance: Min guard Stair Management: One rail Right;Step to pattern;Forwards;With crutches Number of Stairs: 4 General stair comments: cues for sequence and foot/crutch placement   Wheelchair Mobility    Modified Rankin (Stroke Patients Only)       Balance  Overall balance assessment: Mild deficits observed, not formally tested                                          Cognition Arousal/Alertness: Awake/alert Behavior During Therapy: WFL for tasks assessed/performed Overall Cognitive Status: Within Functional Limits for tasks assessed                                        Exercises Total Joint Exercises Ankle Circles/Pumps: AROM;Both;15 reps;Supine Quad Sets: AROM;Both;10 reps;Supine Heel Slides: AAROM;Left;15 reps;Supine Straight Leg Raises: AAROM;AROM;Left;15 reps;Supine Long Arc Quad: AAROM;AROM;Left;10 reps;Supine Knee Flexion: AAROM;AROM;Left;15 reps;Seated    General Comments        Pertinent Vitals/Pain Pain Assessment: 0-10 Pain Score: 6  Pain Location: L knee Pain Descriptors / Indicators: Aching;Sore Pain Intervention(s): Limited activity within patient's tolerance;Monitored during session;Premedicated before session;Ice applied    Home Living                      Prior Function            PT Goals (current goals can now be found in the care plan section) Acute Rehab PT Goals Patient Stated Goal: Regain IND PT Goal Formulation: With patient Time For Goal Achievement: 06/18/18 Potential to Achieve Goals: Good Progress towards PT goals: Progressing toward goals    Frequency    7X/week      PT Plan Current plan remains appropriate    Co-evaluation  AM-PAC PT "6 Clicks" Daily Activity  Outcome Measure  Difficulty turning over in bed (including adjusting bedclothes, sheets and blankets)?: A Lot Difficulty moving from lying on back to sitting on the side of the bed? : A Lot Difficulty sitting down on and standing up from a chair with arms (e.g., wheelchair, bedside commode, etc,.)?: A Lot Help needed moving to and from a bed to chair (including a wheelchair)?: A Little Help needed walking in hospital room?: A Little Help needed climbing 3-5  steps with a railing? : A Little 6 Click Score: 15    End of Session Equipment Utilized During Treatment: Gait belt Activity Tolerance: Patient tolerated treatment well Patient left: in bed;with call bell/phone within reach;with family/visitor present Nurse Communication: Mobility status PT Visit Diagnosis: Difficulty in walking, not elsewhere classified (R26.2)     Time: 1410-1450 PT Time Calculation (min) (ACUTE ONLY): 40 min  Charges:  $Gait Training: 8-22 mins $Therapeutic Exercise: 23-37 mins                     Mauro KaufmannHunter Estle Sabella PT Acute Rehabilitation Services Pager [phone] Office [phone]    Rael Tilly 06/12/2018, 4:29 PM

## 2018-06-12 NOTE — Progress Notes (Signed)
    Subjective:  Patient reports pain as mild to moderate.  Denies N/V/CP/SOB. No c/o  Objective:   VITALS:   Vitals:   06/11/18 2124 06/12/18 0100 06/12/18 0500 06/12/18 1002  BP: (!) 149/92 134/81 (!) 161/80 137/82  Pulse: 74 69 68 65  Resp: 16  16 14   Temp: 97.8 F (36.6 C) (!) 97.5 F (36.4 C) 98 F (36.7 C) (!) 97.5 F (36.4 C)  TempSrc: Oral Oral Oral Oral  SpO2: 100% 93% 97% 95%  Weight:      Height:        NAD ABD soft Sensation intact distally Intact pulses distally Dorsiflexion/Plantar flexion intact Incision: dressing C/D/I Compartment soft   Lab Results  Component Value Date   WBC 12.8 (H) 06/12/2018   HGB 12.4 (L) 06/12/2018   HCT 36.0 (L) 06/12/2018   MCV 91.1 06/12/2018   PLT 208 06/12/2018   BMET    Component Value Date/Time   NA 142 06/12/2018 0440   K 4.2 06/12/2018 0440   CL 109 06/12/2018 0440   CO2 25 06/12/2018 0440   GLUCOSE 127 (H) 06/12/2018 0440   BUN 18 06/12/2018 0440   CREATININE 0.86 06/12/2018 0440   CALCIUM 8.7 (L) 06/12/2018 0440   GFRNONAA >60 06/12/2018 0440   GFRAA >60 06/12/2018 0440     Assessment/Plan: 1 Day Post-Op   Principal Problem:   Osteoarthritis of left knee   WBAT with walker DVT ppx: Aspirin, SCDs, TEDS PO pain control PT/OT Dispo: D/C home with outpatient PT   Iline OvenBrian J Zabdi Mis 06/12/2018, 10:13 AM   Samson FredericBrian Mariene Dickerman, MD Cell [phone]

## 2018-06-12 NOTE — Progress Notes (Signed)
Pt was provided with discharge instructions and prescriptions. After discussing the pt's plan of care upon discharge home, the pt reported no further questions or concerns.

## 2018-06-12 NOTE — Discharge Summary (Signed)
Physician Discharge Summary  Patient ID: Ricky Bonilla MRN: [id number] DOB/AGE: [birth year] 67 y.o.  Admit date: 06/11/2018 Discharge date: 06/12/2018  Admission Diagnoses:  Osteoarthritis of left knee  Discharge Diagnoses:  Principal Problem:   Osteoarthritis of left knee   Past Medical History:  Diagnosis Date  . Hypertension   . OA (osteoarthritis)    left knee  . Wears glasses   . Wears hearing aid in both ears   . Wears partial dentures    upper    Surgeries: Procedure(s): LEFT  TOTAL KNEE ARTHROPLASTY WITH COMPUTER NAVIGATION on 06/11/2018   Consultants (if any):   Discharged Condition: Improved  Hospital Course: Ricky Bonilla is an 67 y.o. male who was admitted 06/11/2018 with a diagnosis of Osteoarthritis of left knee and went to the operating room on 06/11/2018 and underwent the above named procedures.    He was given perioperative antibiotics:  Anti-infectives (From admission, onward)   Start     Dose/Rate Route Frequency Ordered Stop   06/11/18 1400  ceFAZolin (ANCEF) IVPB 2g/100 mL premix     2 g 200 mL/hr over 30 Minutes Intravenous Every 6 hours 06/11/18 1214 06/11/18 2029   06/11/18 0630  ceFAZolin (ANCEF) IVPB 2g/100 mL premix     2 g 200 mL/hr over 30 Minutes Intravenous On call to O.R. 06/11/18 1914 06/11/18 0848    .  He was given sequential compression devices, early ambulation, and ASA for DVT prophylaxis.  He benefited maximally from the hospital stay and there were no complications.    Recent vital signs:  Vitals:   06/12/18 0500 06/12/18 1002  BP: (!) 161/80 137/82  Pulse: 68 65  Resp: 16 14  Temp: 98 F (36.7 C) (!) 97.5 F (36.4 C)  SpO2: 97% 95%    Recent laboratory studies:  Lab Results  Component Value Date   HGB 12.4 (L) 06/12/2018   HGB 14.7 06/10/2018   HGB 13.4 09/28/2016   Lab Results  Component Value Date   WBC 12.8 (H) 06/12/2018   PLT 208 06/12/2018   No results found for: INR Lab Results  Component Value  Date   NA 142 06/12/2018   K 4.2 06/12/2018   CL 109 06/12/2018   CO2 25 06/12/2018   BUN 18 06/12/2018   CREATININE 0.86 06/12/2018   GLUCOSE 127 (H) 06/12/2018    Discharge Medications:   Allergies as of 06/12/2018   No Known Allergies     Medication List    TAKE these medications   amLODipine 10 MG tablet Commonly known as:  NORVASC Take 10 mg by mouth every morning.   aspirin 81 MG chewable tablet Chew 1 tablet (81 mg total) by mouth 2 (two) times daily. What changed:  when to take this   docusate sodium 100 MG capsule Commonly known as:  COLACE Take 1 capsule (100 mg total) by mouth 2 (two) times daily.   FISH OIL PO Take 1 capsule by mouth daily.   fosinopril 40 MG tablet Commonly known as:  MONOPRIL Take 40 mg by mouth every morning.   HYDROcodone-acetaminophen 5-325 MG tablet Commonly known as:  NORCO/VICODIN Take 1-2 tablets by mouth every 6 (six) hours as needed (prn postop knee pain).   multivitamin with minerals Tabs tablet Take 1 tablet by mouth daily.   ondansetron 4 MG tablet Commonly known as:  ZOFRAN Take 1 tablet (4 mg total) by mouth every 6 (six) hours as needed for nausea.   propranolol 80  MG tablet Commonly known as:  INDERAL Take 40-80 mg by mouth daily as needed (tremors).       Diagnostic Studies: Dg Knee Left Port  Result Date: 06/11/2018 CLINICAL DATA:  Postop left knee replacement. EXAM: PORTABLE LEFT KNEE - 1-2 VIEW COMPARISON:  None. FINDINGS: Post left total knee replacement. Alignment appears anatomic. No evidence of hardware failure or loosening. No fracture or dislocation. There is expected subcutaneous emphysema about the operative site including intra-articular air. No radiopaque foreign body. IMPRESSION: Post left total knee replacement without evidence of complication. Electronically Signed   By: Simonne ComeJohn  Watts M.D.   On: 06/11/2018 13:02    Disposition: Discharge disposition: 01-Home or Self Care       Discharge  Instructions    Call MD / Call 911   Complete by:  As directed    If you experience chest pain or shortness of breath, CALL 911 and be transported to the hospital emergency room.  If you develope a fever above 101 F, pus (white drainage) or increased drainage or redness at the wound, or calf pain, call your surgeon's office.   Constipation Prevention   Complete by:  As directed    Drink plenty of fluids.  Prune juice may be helpful.  You may use a stool softener, such as Colace (over the counter) 100 mg twice a day.  Use MiraLax (over the counter) for constipation as needed.   Diet - low sodium heart healthy   Complete by:  As directed    Do not put a pillow under the knee. Place it under the heel.   Complete by:  As directed    Driving restrictions   Complete by:  As directed    No driving for 6 weeks   Increase activity slowly as tolerated   Complete by:  As directed    Lifting restrictions   Complete by:  As directed    No lifting for 6 weeks   TED hose   Complete by:  As directed    Use stockings (TED hose) for 2 weeks on both leg(s).  You may remove them at night for sleeping.      Follow-up Information    Johnye Kist, Arlys JohnBrian, MD. Schedule an appointment as soon as possible for a visit in 2 weeks.   Specialty:  Orthopedic Surgery Why:  For wound re-check Contact information: [address] [place] [address] [phone]            Signed: Iline OvenBrian J Mayeli Bornhorst 06/12/2018, 10:17 AM

## 2018-06-16 ENCOUNTER — Other Ambulatory Visit: Payer: Self-pay

## 2018-06-16 ENCOUNTER — Ambulatory Visit (HOSPITAL_COMMUNITY): Payer: PRIVATE HEALTH INSURANCE | Attending: Orthopedic Surgery | Admitting: Physical Therapy

## 2018-06-16 DIAGNOSIS — M25561 Pain in right knee: Secondary | ICD-10-CM | POA: Diagnosis present

## 2018-06-16 DIAGNOSIS — M25661 Stiffness of right knee, not elsewhere classified: Secondary | ICD-10-CM | POA: Insufficient documentation

## 2018-06-16 DIAGNOSIS — M25562 Pain in left knee: Secondary | ICD-10-CM

## 2018-06-16 DIAGNOSIS — M25662 Stiffness of left knee, not elsewhere classified: Secondary | ICD-10-CM | POA: Diagnosis present

## 2018-06-16 DIAGNOSIS — R262 Difficulty in walking, not elsewhere classified: Secondary | ICD-10-CM

## 2018-06-16 NOTE — Patient Instructions (Addendum)
Strengthening: Quadriceps Set    Tighten muscles on top of thighs by pushing knees down into surface. Hold __5__ seconds. Repeat _10___ times per set. Do 1____ sets per session. Do 2____ sessions per day.  http://orth.exer.us/602   Copyright  VHI. All rights reserved.  Self-Mobilization: Heel Slide (Supine)    Slide left heel toward buttocks until a gentle stretch is felt. Hold __3__ seconds. Relax. Repeat __10__ times per set. Do _0___ sets per session. Do __2__ sessions per day.  http://orth.exer.us/710   Copyright  VHI. All rights reserved.  Strengthening: Straight Leg Raise (Phase 1)   Start with your knee bent when it gets easy straighten out slightly,  Keep going until your knee is totally straight.  Tighten muscles on front of right thigh, then lift leg _15___ inches from surface, keeping knee locked.  Repeat __10__ times per set. Do ___1_ sets per session. Do __2__ sessions per day.  http://orth.exer.us/614   Copyright  VHI. All rights reserved.  Strengthening: Hip Abduction (Side-Lying)    Tighten muscles on front of left thigh, then lift leg _15___ inches from surface, keeping knee locked.  Repeat 10____ times per set. Do _1___ sets per session. Do 2___ sessions per day.  http://orth.exer.us/622   Copyright  VHI. All rights reserved.  Heel Raise: Bilateral (Standing)   Complete at a counter holding onto but not pushing at the counter  Rise on balls of feet. Repeat __10__ times per set. Do _1___ sets per session. Do _2___ sessions per day.  http://orth.exer.us/38   Copyright  VHI. All rights reserved.  Knee Extension (Sitting)    Place _0___ pound weight on left ankle and straighten knee fully, lower slowly. Repeat _5-10___ times per set. Do _1___ sets per session. Do _2___ sessions per day.  http://orth.exer.us/732   Copyright  VHI. All rights reserved.

## 2018-06-16 NOTE — Therapy (Signed)
[facility] [address] [place], [place], [address] Phone: [phone]   Fax:  [phone]  Physical Therapy Evaluation  Patient Details  Name: Ricky Bonilla MRN: [id number] Date of Birth: Dec 24, 1950 Referring Provider: Samson Frederic    Encounter Date: 06/16/2018  PT End of Session - 06/16/18 0900    Visit Number  1    Number of Visits  18    Date for PT Re-Evaluation  07/16/18    Authorization Type  The health plan    Authorization - Visit Number  1    Authorization - Number of Visits  60    PT Start Time  0901    PT Stop Time  0945    PT Time Calculation (min)  44 min    Activity Tolerance  Patient tolerated treatment well    Behavior During Therapy  [facility] for tasks assessed/performed       Past Medical History:  Diagnosis Date  . Hypertension   . OA (osteoarthritis)    left knee  . Wears glasses   . Wears hearing aid in both ears   . Wears partial dentures    upper    Past Surgical History:  Procedure Laterality Date  . APPENDECTOMY  1979 approx.  . COLONOSCOPY N/A 08/07/2016   Procedure: COLONOSCOPY;  Surgeon: Franky Macho, MD;  [facility];  Service: Gastroenterology;  Laterality: N/A;  . KNEE ARTHROPLASTY Right 09/27/2016   Procedure: RIGHT TOTAL KNEE ARTHROPLASTY WITH COMPUTER NAVIGATION;  Surgeon: Samson Frederic, MD;  Location: [facility];  Service: Orthopedics;  Laterality: Right;  Combined spinal/epidural+regional block  . KNEE ARTHROPLASTY Left 06/11/2018   Procedure: LEFT  TOTAL KNEE ARTHROPLASTY WITH COMPUTER NAVIGATION;  Surgeon: Samson Frederic, MD;  Location: [facility];  Service: Orthopedics;  Laterality: Left;  Needs RNFA  . ROTATOR CUFF REPAIR Bilateral 2011 approx.    There were no vitals filed for this visit.   Subjective Assessment - 06/16/18 0852    Subjective  Pt states that he had LT TKR on9/08/2018.   He is currently walking with a walker.  Prior to the operation he was still working.  He is  waking up 5-6 times a night .    Pertinent History  OA, HTN     Limitations  Sitting;Standing;Walking;House hold activities;Lifting    How long can you sit comfortably?  15 minutes     How long can you stand comfortably?  25 minutes     How long can you walk comfortably?  Walking with a walker he has walked at the most for five minutes     Patient Stated Goals  less pain, better movement, better sleep, walk without the walker     Currently in Pain?  Yes    Pain Score  8    worst 9/10; best 4/10    Pain Location  Knee    Pain Orientation  Left    Pain Descriptors / Indicators  Aching;Throbbing;Tightness    Pain Type  Acute pain    Pain Onset  1 to 4 weeks ago    Pain Frequency  Intermittent    Aggravating Factors   full range     Pain Relieving Factors  rest, med, ice    Effect of Pain on Daily Activities  limits          [facility] PT Assessment - 06/16/18 0001      Assessment   Medical Diagnosis  Lt TKR  Referring Provider  Samson Frederic     Onset Date/Surgical Date  06/11/18    Prior Therapy  acute      Precautions   Precautions  None      Restrictions   Weight Bearing Restrictions  No      Balance Screen   Has the patient fallen in the past 6 months  No    Has the patient had a decrease in activity level because of a fear of falling?   Yes    Is the patient reluctant to leave their home because of a fear of falling?   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     Leisure  no time       Cognition   Overall Cognitive Status  Within Functional Limits for tasks assessed      Observation/Other Assessments   Focus on Therapeutic Outcomes (FOTO)   38      Observation/Other Assessments-Edema    Edema  Circumferential      Circumferential Edema   Circumferential - Right  40 cm     Circumferential  - Left   mid patella 45 cm       Functional Tests   Functional tests  Single leg stance;Sit to Stand      Single Leg Stance   Comments  RT 3 seconds Lt 0      Sit to Stand   Comments  needs to use B UE       ROM / Strength   AROM / PROM / Strength  AROM;Strength      AROM   AROM Assessment Site  Knee    Right/Left Knee  --    Left Knee Extension  9    Left Knee Flexion  75      Strength   Strength Assessment Site  Hip;Knee;Ankle    Right/Left Hip  Right;Left    Right Hip Extension  5/5    Right Hip ABduction  5/5    Left Hip Flexion  2/5    Left Hip Extension  4/5    Left Hip ABduction  4+/5    Right/Left Knee  Right;Left    Right Knee Flexion  5/5    Right Knee Extension  5/5    Left Knee Flexion  4/5    Left Knee Extension  3-/5    Right/Left Ankle  Right;Left    Right Ankle Dorsiflexion  5/5    Left Ankle Dorsiflexion  5/5                Objective measurements completed on examination: See above findings.      [facility] Adult PT Treatment/Exercise - 06/16/18 0001      Exercises   Exercises  Knee/Hip      Knee/Hip Exercises: Seated   Long Arc Quad  Left;5 reps      Knee/Hip Exercises: Supine   Quad Sets  Left;10 reps    Heel Slides  Left;5 reps    Straight Leg Raises  Left;10 reps    Straight Leg Raises Limitations  knee slightly bent      Knee/Hip Exercises: Sidelying   Hip ABduction  Left;5 reps      Manual Therapy   Manual Therapy  Edema management    Manual therapy comments  separate from all other aspects of treatement    Edema Management  decongestive techniques for entire LE              PT Education - 06/16/18 1141    Education Details  HEP, to ice more frequently    Person(s) Educated  Patient    Methods  Explanation    Comprehension  Verbalized understanding       PT Short Term Goals - 06/16/18 1148      PT SHORT TERM GOAL #1   Title  PT pain in his left knee to be no greater than a 5/10 so that he is not waking any  greater than 2 x a night     Time  3    Period  Weeks    Status  New    Target Date  07/07/18      PT SHORT TERM GOAL #2   Title  PT to be able to bend his left knee to 100 degrees to allow him to sit for 30 minutes in comfort to meals.    Time  3    Period  Weeks    Status  New      PT SHORT TERM GOAL #3   Title  PT left knee extension to be five degrees or less to normalize gait.     Time  3    Period  Weeks    Status  New      PT SHORT TERM GOAL #4   Title  PT to be walking with a cane and to have increased his walking to at least 15 minutes without stopping.     Time  3    Period  Weeks    Status  New        PT Long Term Goals - 06/16/18 1152      PT LONG TERM GOAL #1   Title  PT pain in his left knee to be no greater than a 2/10 to allow pt to sleep throughout the night without waking due to pain.     Time  6    Period  Weeks    Status  New    Target Date  07/28/18      PT LONG TERM GOAL #2   Title  PT left knee flexion to be to 120 degrees to allow pt to squat to pick something off of the floor .     Time  6    Period  Weeks    Status  New      PT LONG TERM GOAL #3   Title  PT left knee extension to be less than 3 degrees to allow normalized heel toe gait.     Time  6    Period  Weeks    Status  New      PT LONG TERM GOAL #4   Title  PT to be able to walk in comfort for up to 45 minutes for return to work duties.     Time  6    Period  Weeks    Status  New      PT LONG TERM GOAL #5   Title  PT mm strength in LT LE to be at least 4+/5 to be able to go up and down steps in a reciprocal manner with one hand hold x 10 steps     Time  6  Period  Weeks    Status  New      PT LONG TERM GOAL #6   Title  Pt to be able to single leg stance x 20 seconds on B LE to improve safety when walking on uneven terrain at work     Time  6    Period  Weeks    Status  New             Plan - 06/16/18 1141    Clinical Impression Statement  Mr. Ladona Ridgelaylor is a 67  yo male who opted to have a LT TKR on 06/11/2018 and is currently being referred to skilled therapy to improve his functional mobility.  Evaluation demontstrates decreased ROM, decreased balance, decreased mm strength, decreased activity tolerance, increased edema and increased pain.   Mr. Ladona Ridgelaylor will benefit from skilled physical therapy to address these issues and maximize his functional ability.      History and Personal Factors relevant to plan of care:  OA with past TKR on his RT, HTN    Clinical Presentation  Stable    Clinical Decision Making  Moderate    Rehab Potential  Good    PT Frequency  3x / week    PT Duration  6 weeks    PT Treatment/Interventions  ADLs/Self Care Home Management;Cryotherapy;Gait training;Stair training;Functional mobility training;Therapeutic activities;Therapeutic exercise;Balance training;Neuromuscular re-education;Patient/family education;Manual lymph drainage;Manual techniques;Passive range of motion    PT Next Visit Plan  Attempt active supine hamstring stretch, standing heelraises, functional squat and knee flextion.  Continue manual to decrease edema.     PT Home Exercise Plan  QS, modified SLR, heelslide, hip abduction and sitting LAQ     Consulted and Agree with Plan of Care  Patient       Patient will benefit from skilled therapeutic intervention in order to improve the following deficits and impairments:  Abnormal gait, Decreased activity tolerance, Decreased balance, Decreased range of motion, Decreased mobility, Decreased strength, Difficulty walking, Decreased skin integrity, Pain, Impaired flexibility  Visit Diagnosis: Acute pain of left knee - Plan: PT plan of care cert/re-cert  Stiffness of left knee, not elsewhere classified - Plan: PT plan of care cert/re-cert  Difficulty in walking, not elsewhere classified - Plan: PT plan of care cert/re-cert     Problem List Patient Active Problem List   Diagnosis Date Noted  . Osteoarthritis of left  knee 06/11/2018  . Osteoarthritis of right knee 09/27/2016    Virgina OrganCynthia Aireona Torelli, PT CLT [phone] 06/16/2018, 12:00 PM  [facility] [address] [place], [place], [address] Phone: [phone]   Fax:  [phone]  Name: Ricky Bonilla MRN: [id number] Date of Birth: 07/25/1951

## 2018-06-18 ENCOUNTER — Ambulatory Visit (HOSPITAL_COMMUNITY): Payer: PRIVATE HEALTH INSURANCE

## 2018-06-18 ENCOUNTER — Encounter (HOSPITAL_COMMUNITY): Payer: Self-pay

## 2018-06-18 DIAGNOSIS — M25562 Pain in left knee: Secondary | ICD-10-CM | POA: Diagnosis not present

## 2018-06-18 DIAGNOSIS — R262 Difficulty in walking, not elsewhere classified: Secondary | ICD-10-CM

## 2018-06-18 DIAGNOSIS — M25662 Stiffness of left knee, not elsewhere classified: Secondary | ICD-10-CM

## 2018-06-18 NOTE — Therapy (Signed)
[facility] [address] [place], [place], [address] Phone: [phone]   Fax:  [phone]  Physical Therapy Treatment  Patient Details  Name: Ricky Bonilla MRN: [id number] Date of Birth: 07-11-1951 Referring Provider: Samson Frederic    Encounter Date: 06/18/2018  PT End of Session - 06/18/18 1307    Visit Number  2    Number of Visits  18    Date for PT Re-Evaluation  07/16/18   Mini-reassess 07/07/18   Authorization Type  The health plan    Authorization Time Period  Cert: 4/13-->24/40/10    Authorization - Visit Number  2    Authorization - Number of Visits  60    PT Start Time  1303    PT Stop Time  1346    PT Time Calculation (min)  43 min    Activity Tolerance  Patient tolerated treatment well;No increased pain    Behavior During Therapy  WFL for tasks assessed/performed       Past Medical History:  Diagnosis Date  . Hypertension   . OA (osteoarthritis)    left knee  . Wears glasses   . Wears hearing aid in Bonilla ears   . Wears partial dentures    upper    Past Surgical History:  Procedure Laterality Date  . APPENDECTOMY  1979 approx.  . COLONOSCOPY N/A 08/07/2016   Procedure: COLONOSCOPY;  Surgeon: Franky Macho, MD;  [facility];  Service: Gastroenterology;  Laterality: N/A;  . KNEE ARTHROPLASTY Right 09/27/2016   Procedure: RIGHT TOTAL KNEE ARTHROPLASTY WITH COMPUTER NAVIGATION;  Surgeon: Samson Frederic, MD;  [facility]: [facility];  Service: Orthopedics;  Laterality: Right;  Combined spinal/epidural+regional block  . KNEE ARTHROPLASTY Left 06/11/2018   Procedure: LEFT  TOTAL KNEE ARTHROPLASTY WITH COMPUTER NAVIGATION;  Surgeon: Samson Frederic, MD;  Location: [facility];  Service: Orthopedics;  Laterality: Left;  Needs RNFA  . ROTATOR CUFF REPAIR Bilateral 2011 approx.    There were no vitals filed for this visit.  Subjective Assessment - 06/18/18 1305    Subjective  Pt stated he has increased swelling  compared to when he had Rt knee replaced, current pain scale 7/10 for Lt knee sore achey stiff pain.  Continues to difficulty sleeping at night.      Patient Stated Goals  less pain, better movement, better sleep, walk without the walker     Currently in Pain?  Yes    Pain Score  7     Pain Location  Knee    Pain Orientation  Left    Pain Descriptors / Indicators  Aching;Dull;Tightness    Pain Type  Acute pain    Pain Onset  1 to 4 weeks ago    Pain Frequency  Intermittent    Aggravating Factors   full range     Pain Relieving Factors  rest, medication, ice    Effect of Pain on Daily Activities  limits                       [facility] Adult PT Treatment/Exercise - 06/18/18 0001      Exercises   Exercises  Knee/Hip      Knee/Hip Exercises: Stretches   Active Hamstring Stretch  Left;3 reps;30 seconds    Active Hamstring Stretch Limitations  supine wiht rope      Knee/Hip Exercises: Standing   Heel Raises  10 reps    Hip Flexion  10 reps  Functional Squat  10 reps      Knee/Hip Exercises: Supine   Quad Sets  Left;10 reps    Short Arc Quad Sets  Left;10 reps    Heel Slides  Left;10 reps    Straight Leg Raises  Left;10 reps    Straight Leg Raises Limitations  knee slightly bent    Knee Extension  AROM    Knee Extension Limitations  8   was 9   Knee Flexion  AROM    Knee Flexion Limitations  96   was 75     Manual Therapy   Manual Therapy  Edema management    Manual therapy comments  separate from all other aspects of treatement    Edema Management  retrograde massage             PT Education - 06/18/18 1315    Education Details  Reviewed goals, assured compliance wiht HEP and copy of eval given to pt.  Reviewed RICE technqiues to assist with edema and pain.      Person(s) Educated  Patient    Methods  Explanation;Demonstration;Handout    Comprehension  Verbalized understanding;Returned demonstration       PT Short Term Goals - 06/16/18 1148       PT SHORT TERM GOAL #1   Title  PT pain in his left knee to be no greater than a 5/10 so that he is not waking any greater than 2 x a night     Time  3    Period  Weeks    Status  New    Target Date  07/07/18      PT SHORT TERM GOAL #2   Title  PT to be able to bend his left knee to 100 degrees to allow him to sit for 30 minutes in comfort to meals.    Time  3    Period  Weeks    Status  New      PT SHORT TERM GOAL #3   Title  PT left knee extension to be five degrees or less to normalize gait.     Time  3    Period  Weeks    Status  New      PT SHORT TERM GOAL #4   Title  PT to be walking with a cane and to have increased his walking to at least 15 minutes without stopping.     Time  3    Period  Weeks    Status  New        PT Long Term Goals - 06/16/18 1152      PT LONG TERM GOAL #1   Title  PT pain in his left knee to be no greater than a 2/10 to allow pt to sleep throughout the night without waking due to pain.     Time  6    Period  Weeks    Status  New    Target Date  07/28/18      PT LONG TERM GOAL #2   Title  PT left knee flexion to be to 120 degrees to allow pt to squat to pick something off of the floor .     Time  6    Period  Weeks    Status  New      PT LONG TERM GOAL #3   Title  PT left knee extension to be less than 3 degrees to allow normalized heel toe gait.  Time  6    Period  Weeks    Status  New      PT LONG TERM GOAL #4   Title  PT to be able to walk in comfort for up to 45 minutes for return to work duties.     Time  6    Period  Weeks    Status  New      PT LONG TERM GOAL #5   Title  PT mm strength in LT LE to be at least 4+/5 to be able to go up and down steps in a reciprocal manner with one hand hold x 10 steps     Time  6    Period  Weeks    Status  New      PT LONG TERM GOAL #6   Title  Pt to be able to single leg stance x 20 seconds on B LE to improve safety when walking on uneven terrain at work     Time  6    Period   Weeks    Status  New            Plan - 06/18/18 1315    Clinical Impression Statement  Reviewed goals of evaluation, assured compliance and proper technique wiht HEP and copy of eval given to pt.  Reviewed RICE techniques to assist with edema and pain.  Session focus with ROM based exercises as well as hip strengthening and manual technqiues to assist with edema for pain and mobility.  Pt able to recall majority of exercises and able to demonstrate wiht minimal cueing required.  Main difficulty with quadricep concration and cueing required to reduce compensation wiht gluteal activation.  Added standing squats, heel/toe raises and knee flexion for strengthening with minimal cueing for form.  EOS wiht manual retrograde massage to assist with swelling, pain and mobility.  Pt making good gains with AROM 8-96 degrees (was 9-75 degrees last session.)  Reports pain reduced to 4/10 at EOS (was 7/10 initially).      Rehab Potential  Good    PT Frequency  3x / week    PT Duration  6 weeks    PT Treatment/Interventions  ADLs/Self Care Home Management;Cryotherapy;Gait training;Stair training;Functional mobility training;Therapeutic activities;Therapeutic exercise;Balance training;Neuromuscular re-education;Patient/family education;Manual lymph drainage;Manual techniques;Passive range of motion    PT Next Visit Plan  Continue ROM based exercises and hip strengthening.  Add rockerboard and progress to LRAD as able next session.  Add TKE as appropriate.  Continue manual to decrease edema.      PT Home Exercise Plan  QS, modified SLR, heelslide, hip abduction and sitting LAQ        Patient will benefit from skilled therapeutic intervention in order to improve the following deficits and impairments:  Abnormal gait, Decreased activity tolerance, Decreased balance, Decreased range of motion, Decreased mobility, Decreased strength, Difficulty walking, Decreased skin integrity, Pain, Impaired flexibility  Visit  Diagnosis: Acute pain of left knee  Stiffness of left knee, not elsewhere classified  Difficulty in walking, not elsewhere classified     Problem List Patient Active Problem List   Diagnosis Date Noted  . Osteoarthritis of left knee 06/11/2018  . Osteoarthritis of right knee 09/27/2016   Becky Sax, LPTA; CBIS [phone]  Juel Burrow 06/18/2018, 1:59 PM  [facility] [address] [place], [place], [address] Phone: [phone]   Fax:  [phone]  Name: Ricky Bonilla MRN: [id number] Date of Birth: [birth year]

## 2018-06-19 ENCOUNTER — Encounter (HOSPITAL_COMMUNITY): Payer: PRIVATE HEALTH INSURANCE

## 2018-06-20 ENCOUNTER — Ambulatory Visit (HOSPITAL_COMMUNITY): Payer: PRIVATE HEALTH INSURANCE | Admitting: Physical Therapy

## 2018-06-20 ENCOUNTER — Encounter (HOSPITAL_COMMUNITY): Payer: Self-pay | Admitting: Physical Therapy

## 2018-06-20 DIAGNOSIS — M25562 Pain in left knee: Secondary | ICD-10-CM | POA: Diagnosis not present

## 2018-06-20 DIAGNOSIS — M25662 Stiffness of left knee, not elsewhere classified: Secondary | ICD-10-CM

## 2018-06-20 DIAGNOSIS — R262 Difficulty in walking, not elsewhere classified: Secondary | ICD-10-CM

## 2018-06-20 NOTE — Therapy (Signed)
[facility] [address] [place], [place], [address] Phone: [phone]   Fax:  [phone]  Physical Therapy Treatment  Patient Details  Name: Ricky Bonilla MRN: [id number] Date of Birth: 31-Jul-1951 Referring Provider: Samson Frederic    Encounter Date: 06/20/2018  PT End of Session - 06/20/18 1441    Visit Number  3    Number of Visits  18    Date for PT Re-Evaluation  07/16/18   Mini-reassess 07/07/18   Authorization Type  The health plan    Authorization Time Period  Cert: 6/96-->29/52/84    Authorization - Visit Number  3    Authorization - Number of Visits  60    PT Start Time  1430    PT Stop Time  1508    PT Time Calculation (min)  38 min    Activity Tolerance  Patient tolerated treatment well;No increased pain    Behavior During Therapy  WFL for tasks assessed/performed       Past Medical History:  Diagnosis Date  . Hypertension   . OA (osteoarthritis)    left knee  . Wears glasses   . Wears hearing aid in both ears   . Wears partial dentures    upper    Past Surgical History:  Procedure Laterality Date  . APPENDECTOMY  1979 approx.  . COLONOSCOPY N/A 08/07/2016   Procedure: COLONOSCOPY;  Surgeon: Franky Macho, MD;  [facility];  Service: Gastroenterology;  Laterality: N/A;  . KNEE ARTHROPLASTY Right 09/27/2016   Procedure: RIGHT TOTAL KNEE ARTHROPLASTY WITH COMPUTER NAVIGATION;  Surgeon: Samson Frederic, MD;  Location: [facility];  Service: Orthopedics;  Laterality: Right;  Combined spinal/epidural+regional block  . KNEE ARTHROPLASTY Left 06/11/2018   Procedure: LEFT  TOTAL KNEE ARTHROPLASTY WITH COMPUTER NAVIGATION;  Surgeon: Samson Frederic, MD;  Location: [facility];  Service: Orthopedics;  Laterality: Left;  Needs RNFA  . ROTATOR CUFF REPAIR Bilateral 2011 approx.    There were no vitals filed for this visit.  Subjective Assessment - 06/20/18 1436    Subjective  Patient reported that he recently had a  pain pill before therapy.     Patient Stated Goals  less pain, better movement, better sleep, walk without the walker     Currently in Pain?  Yes    Pain Score  2     Pain Location  Knee    Pain Orientation  Left    Pain Descriptors / Indicators  Aching;Tightness    Pain Type  Acute pain    Pain Onset  1 to 4 weeks ago                       [facility] Adult PT Treatment/Exercise - 06/20/18 0001      Exercises   Exercises  Knee/Hip      Knee/Hip Exercises: Stretches   Passive Hamstring Stretch  Left;3 reps;30 seconds    Passive Hamstring Stretch Limitations  On 6'' step inside // bars    Knee: Self-Stretch to increase Flexion  Left;10 seconds;Limitations    Knee: Self-Stretch Limitations  10 repetitions on 6'' step    Gastroc Stretch  Both;3 reps;30 seconds      Knee/Hip Exercises: Standing   Heel Raises  10 reps    Hip Flexion  10 reps    Functional Squat  10 reps    Rocker Board  2 minutes    Rocker Board Limitations  Left and right  Knee/Hip Exercises: Supine   Quad Sets  Left;10 reps    Short Arc Quad Sets  Left;10 reps    Heel Slides  Left;10 reps    Straight Leg Raises  Left;10 reps    Straight Leg Raises Limitations  knee slightly bent    Knee Extension  AROM    Knee Extension Limitations  7   was 8   Knee Flexion  AROM    Knee Flexion Limitations  96   was 96     Manual Therapy   Manual Therapy  Edema management    Manual therapy comments  separate from all other aspects of treatement    Edema Management  retrograde massage             PT Education - 06/20/18 1440    Education Details  Discussed purpose and technique of interventions throughout session.     Person(s) Educated  Patient    Methods  Explanation;Demonstration    Comprehension  Verbalized understanding       PT Short Term Goals - 06/20/18 1520      PT SHORT TERM GOAL #1   Title  PT pain in his left knee to be no greater than a 5/10 so that he is not waking any  greater than 2 x a night     Time  3    Period  Weeks    Status  On-going      PT SHORT TERM GOAL #2   Title  PT to be able to bend his left knee to 100 degrees to allow him to sit for 30 minutes in comfort to meals.    Time  3    Period  Weeks    Status  On-going      PT SHORT TERM GOAL #3   Title  PT left knee extension to be five degrees or less to normalize gait.     Baseline  06/20/18: Left knee extension 7 degrees this session    Time  3    Period  Weeks    Status  On-going      PT SHORT TERM GOAL #4   Title  PT to be walking with a cane and to have increased his walking to at least 15 minutes without stopping.     Time  3    Period  Weeks    Status  On-going        PT Long Term Goals - 06/20/18 1520      PT LONG TERM GOAL #1   Title  PT pain in his left knee to be no greater than a 2/10 to allow pt to sleep throughout the night without waking due to pain.     Time  6    Period  Weeks    Status  On-going      PT LONG TERM GOAL #2   Title  PT left knee flexion to be to 120 degrees to allow pt to squat to pick something off of the floor .     Baseline  06/20/18: Patient's knee flexion at 96 degrees    Time  6    Period  Weeks    Status  On-going      PT LONG TERM GOAL #3   Title  PT left knee extension to be less than 3 degrees to allow normalized heel toe gait.     Time  6    Period  Weeks    Status  On-going      PT LONG TERM GOAL #4   Title  PT to be able to walk in comfort for up to 45 minutes for return to work duties.     Time  6    Period  Weeks    Status  On-going      PT LONG TERM GOAL #5   Title  PT mm strength in LT LE to be at least 4+/5 to be able to go up and down steps in a reciprocal manner with one hand hold x 10 steps     Time  6    Period  Weeks    Status  On-going      PT LONG TERM GOAL #6   Title  Pt to be able to single leg stance x 20 seconds on B LE to improve safety when walking on uneven terrain at work     Time  6    Period   Weeks    Status  On-going            Plan - 06/20/18 1519    Clinical Impression Statement  This session began with stretching and added gastroc stretching, hamstring stretching in standing, and rockerboard. Patient required demonstration, verbal cues and tactile cues for proper form throughout session. Following standing exercises, patient performed supine exercises. Finally finished with manual therapy for edema reduction. Patient's left knee AROM ranged from 7-96 this session. Patient also reported a decrease in pain following manual therapy. Patient would continue to benefit from skilled physical therapy in order to continue progressing towards functional goals.     Rehab Potential  Good    PT Frequency  3x / week    PT Duration  6 weeks    PT Treatment/Interventions  ADLs/Self Care Home Management;Cryotherapy;Gait training;Stair training;Functional mobility training;Therapeutic activities;Therapeutic exercise;Balance training;Neuromuscular re-education;Patient/family education;Manual lymph drainage;Manual techniques;Passive range of motion    PT Next Visit Plan  Continue ROM based exercises and hip strengthening.  progress to LRAD as able next session.  Add TKE as appropriate.  Continue manual to decrease edema.      PT Home Exercise Plan  QS, modified SLR, heelslide, hip abduction and sitting LAQ        Patient will benefit from skilled therapeutic intervention in order to improve the following deficits and impairments:  Abnormal gait, Decreased activity tolerance, Decreased balance, Decreased range of motion, Decreased mobility, Decreased strength, Difficulty walking, Decreased skin integrity, Pain, Impaired flexibility  Visit Diagnosis: Acute pain of left knee  Stiffness of left knee, not elsewhere classified  Difficulty in walking, not elsewhere classified     Problem List Patient Active Problem List   Diagnosis Date Noted  . Osteoarthritis of left knee 06/11/2018  .  Osteoarthritis of right knee 09/27/2016   Verne Carrow PT, DPT 3:23 PM, 06/20/18 [phone]  [facility] [address] [place], [place], [address] Phone: [phone]   Fax:  [phone]  Name: Ricky Bonilla MRN: [id number] Date of Birth: May 21, 1951

## 2018-06-23 ENCOUNTER — Ambulatory Visit (HOSPITAL_COMMUNITY): Payer: PRIVATE HEALTH INSURANCE | Admitting: Physical Therapy

## 2018-06-23 DIAGNOSIS — M25562 Pain in left knee: Secondary | ICD-10-CM | POA: Diagnosis not present

## 2018-06-23 DIAGNOSIS — M25662 Stiffness of left knee, not elsewhere classified: Secondary | ICD-10-CM

## 2018-06-23 DIAGNOSIS — R262 Difficulty in walking, not elsewhere classified: Secondary | ICD-10-CM

## 2018-06-23 NOTE — Therapy (Signed)
[facility] [address] [place], [place], [address] Phone: [phone]   Fax:  [phone]  Physical Therapy Treatment  Patient Details  Name: Ricky Bonilla MRN: [id number] Date of Birth: Aug 28, 1951 Referring Provider: Samson Frederic    Encounter Date: 06/23/2018  PT End of Session - 06/23/18 1426    Visit Number  4    Number of Visits  18    Date for PT Re-Evaluation  07/16/18   Mini-reassess 07/07/18   Authorization Type  The health plan    Authorization Time Period  Cert: 6/96-->29/52/84    Authorization - Visit Number  4    Authorization - Number of Visits  60    PT Start Time  1345    PT Stop Time  1425    PT Time Calculation (min)  40 min    Activity Tolerance  Patient tolerated treatment well;No increased pain    Behavior During Therapy  WFL for tasks assessed/performed       Past Medical History:  Diagnosis Date  . Hypertension   . OA (osteoarthritis)    left knee  . Wears glasses   . Wears hearing aid in both ears   . Wears partial dentures    upper    Past Surgical History:  Procedure Laterality Date  . APPENDECTOMY  1979 approx.  . COLONOSCOPY N/A 08/07/2016   Procedure: COLONOSCOPY;  Surgeon: Franky Macho, MD;  [facility];  Service: Gastroenterology;  Laterality: N/A;  . KNEE ARTHROPLASTY Right 09/27/2016   Procedure: RIGHT TOTAL KNEE ARTHROPLASTY WITH COMPUTER NAVIGATION;  Surgeon: Samson Frederic, MD;  [facility];  Service: Orthopedics;  Laterality: Right;  Combined spinal/epidural+regional block  . KNEE ARTHROPLASTY Left 06/11/2018   Procedure: LEFT  TOTAL KNEE ARTHROPLASTY WITH COMPUTER NAVIGATION;  Surgeon: Samson Frederic, MD;  [facility];  Service: Orthopedics;  Laterality: Left;  Needs RNFA  . ROTATOR CUFF REPAIR Bilateral 2011 approx.    There were no vitals filed for this visit.  Subjective Assessment - 06/23/18 1350    Subjective  Pt states he's having pain right now of  6/10.   Reports "so so" compliance with HEP.    Currently in Pain?  Yes    Pain Score  6     Pain Location  Knee    Pain Orientation  Left    Pain Descriptors / Indicators  Aching;Tightness                       [facility] Adult PT Treatment/Exercise - 06/23/18 0001      Knee/Hip Exercises: Stretches   Passive Hamstring Stretch  Left;3 reps;30 seconds    Passive Hamstring Stretch Limitations  On 12'' step inside // bars    Knee: Self-Stretch to increase Flexion  Left;10 seconds;Limitations    Knee: Self-Stretch Limitations  10 repetitions on 12'' step    Gastroc Stretch  Both;3 reps;30 seconds      Knee/Hip Exercises: Standing   Heel Raises  15 reps;Limitations    Heel Raises Limitations  toeraises 15 reps     Hip Flexion  10 reps    Functional Squat  10 reps    Rocker Board  2 minutes    Rocker Board Limitations  Left and right      Knee/Hip Exercises: Supine   Quad Sets  Left;15 reps    Short Arc Quad Sets  Left;15 reps    Heel Slides  Left;15  reps    Straight Leg Raises  Left;15 reps    Knee Extension  AROM    Knee Extension Limitations  8    Knee Flexion  AROM    Knee Flexion Limitations  108      Manual Therapy   Manual Therapy  Edema management    Manual therapy comments  separate from all other aspects of treatement    Edema Management  retrograde massage               PT Short Term Goals - 06/20/18 1520      PT SHORT TERM GOAL #1   Title  PT pain in his left knee to be no greater than a 5/10 so that he is not waking any greater than 2 x a night     Time  3    Period  Weeks    Status  On-going      PT SHORT TERM GOAL #2   Title  PT to be able to bend his left knee to 100 degrees to allow him to sit for 30 minutes in comfort to meals.    Time  3    Period  Weeks    Status  On-going      PT SHORT TERM GOAL #3   Title  PT left knee extension to be five degrees or less to normalize gait.     Baseline  06/20/18: Left knee extension 7  degrees this session    Time  3    Period  Weeks    Status  On-going      PT SHORT TERM GOAL #4   Title  PT to be walking with a cane and to have increased his walking to at least 15 minutes without stopping.     Time  3    Period  Weeks    Status  On-going        PT Long Term Goals - 06/20/18 1520      PT LONG TERM GOAL #1   Title  PT pain in his left knee to be no greater than a 2/10 to allow pt to sleep throughout the night without waking due to pain.     Time  6    Period  Weeks    Status  On-going      PT LONG TERM GOAL #2   Title  PT left knee flexion to be to 120 degrees to allow pt to squat to pick something off of the floor .     Baseline  06/20/18: Patient's knee flexion at 96 degrees    Time  6    Period  Weeks    Status  On-going      PT LONG TERM GOAL #3   Title  PT left knee extension to be less than 3 degrees to allow normalized heel toe gait.     Time  6    Period  Weeks    Status  On-going      PT LONG TERM GOAL #4   Title  PT to be able to walk in comfort for up to 45 minutes for return to work duties.     Time  6    Period  Weeks    Status  On-going      PT LONG TERM GOAL #5   Title  PT mm strength in LT LE to be at least 4+/5 to be able to go up and down steps in  a reciprocal manner with one hand hold x 10 steps     Time  6    Period  Weeks    Status  On-going      PT LONG TERM GOAL #6   Title  Pt to be able to single leg stance x 20 seconds on B LE to improve safety when walking on uneven terrain at work     Time  6    Period  Weeks    Status  On-going            Plan - 06/23/18 1427    Clinical Impression Statement  continued with primary focus on ROM.  Pt comes today without SPC stating he thinks he has lost and it and is going to get anther.  Noted antalgia without AD.  ROM imprved for flexion today to 108 degrees.  Manual helped to reduce pain at end of session.     Rehab Potential  Good    PT Frequency  3x / week    PT Duration   6 weeks    PT Treatment/Interventions  ADLs/Self Care Home Management;Cryotherapy;Gait training;Stair training;Functional mobility training;Therapeutic activities;Therapeutic exercise;Balance training;Neuromuscular re-education;Patient/family education;Manual lymph drainage;Manual techniques;Passive range of motion    PT Next Visit Plan  Continue ROM based exercises and hip strengthening.  Begin SLS, tandem next session.    PT Home Exercise Plan  QS, modified SLR, heelslide, hip abduction and sitting LAQ        Patient will benefit from skilled therapeutic intervention in order to improve the following deficits and impairments:  Abnormal gait, Decreased activity tolerance, Decreased balance, Decreased range of motion, Decreased mobility, Decreased strength, Difficulty walking, Decreased skin integrity, Pain, Impaired flexibility  Visit Diagnosis: No diagnosis found.     Problem List Patient Active Problem List   Diagnosis Date Noted  . Osteoarthritis of left knee 06/11/2018  . Osteoarthritis of right knee 09/27/2016   Lurena NidaAmy B Frazier, PTA/CLT [phone]  Lurena NidaFrazier, Amy B 06/23/2018, 2:29 PM  [facility] [address] [place], [place], [address] Phone: [phone]   Fax:  [phone]  Name: Ricky Bonilla MRN: [id number] Date of Birth: 05/16/1951

## 2018-06-25 ENCOUNTER — Ambulatory Visit (HOSPITAL_COMMUNITY): Payer: PRIVATE HEALTH INSURANCE | Admitting: Physical Therapy

## 2018-06-25 DIAGNOSIS — M25662 Stiffness of left knee, not elsewhere classified: Secondary | ICD-10-CM

## 2018-06-25 DIAGNOSIS — M25562 Pain in left knee: Secondary | ICD-10-CM

## 2018-06-25 DIAGNOSIS — R262 Difficulty in walking, not elsewhere classified: Secondary | ICD-10-CM

## 2018-06-25 NOTE — Therapy (Signed)
[facility] [address] [place], [place], [address] Phone: [phone]   Fax:  [phone]  Physical Therapy Treatment  Patient Details  Name: Ricky Bonilla MRN: [id number] Date of Birth: 03-05-1951 Referring Provider: Samson Frederic    Encounter Date: 06/25/2018  PT End of Session - 06/25/18 1505    Visit Number  5    Number of Visits  18    Date for PT Re-Evaluation  07/16/18   Mini-reassess 07/07/18   Authorization Type  The health plan    Authorization Time Period  Cert: 6/96-->29/52/84    Authorization - Visit Number  5    Authorization - Number of Visits  60    PT Start Time  1435    PT Stop Time  1515    PT Time Calculation (min)  40 min    Activity Tolerance  Patient tolerated treatment well;No increased pain    Behavior During Therapy  WFL for tasks assessed/performed       Past Medical History:  Diagnosis Date  . Hypertension   . OA (osteoarthritis)    left knee  . Wears glasses   . Wears hearing aid in both ears   . Wears partial dentures    upper    Past Surgical History:  Procedure Laterality Date  . APPENDECTOMY  1979 approx.  . COLONOSCOPY N/A 08/07/2016   Procedure: COLONOSCOPY;  Surgeon: Franky Macho, MD;  [facility];  Service: Gastroenterology;  Laterality: N/A;  . KNEE ARTHROPLASTY Right 09/27/2016   Procedure: RIGHT TOTAL KNEE ARTHROPLASTY WITH COMPUTER NAVIGATION;  Surgeon: Samson Frederic, MD;  [facility];  Service: Orthopedics;  Laterality: Right;  Combined spinal/epidural+regional block  . KNEE ARTHROPLASTY Left 06/11/2018   Procedure: LEFT  TOTAL KNEE ARTHROPLASTY WITH COMPUTER NAVIGATION;  Surgeon: Samson Frederic, MD;  [facility];  Service: Orthopedics;  Laterality: Left;  Needs RNFA  . ROTATOR CUFF REPAIR Bilateral 2011 approx.    There were no vitals filed for this visit.  Subjective Assessment - 06/25/18 1434    Subjective  PT states he went to the MD yesterday;  states that he was impressed.     Pertinent History  OA, HTN     Limitations  Sitting;Standing;Walking;House hold activities;Lifting    How long can you sit comfortably?  15 minutes     How long can you stand comfortably?  25 minutes     How long can you walk comfortably?  Walking with a walker he has walked at the most for five minutes     Patient Stated Goals  less pain, better movement, better sleep, walk without the walker     Currently in Pain?  Yes    Pain Score  4     Pain Location  Knee    Pain Orientation  Left    Pain Descriptors / Indicators  Aching    Pain Type  Chronic pain    Pain Onset  1 to 4 weeks ago    Pain Frequency  Intermittent                       [facility] Adult PT Treatment/Exercise - 06/25/18 0001      Exercises   Exercises  Knee/Hip      Knee/Hip Exercises: Stretches   Active Hamstring Stretch  Left;3 reps;30 seconds    Active Hamstring Stretch Limitations  supine wiht rope    Passive Hamstring Stretch  Left;3  reps;30 seconds    Passive Hamstring Stretch Limitations  On 12'' step inside // bars    Knee: Self-Stretch to increase Flexion  Left;5 reps;20 seconds    Gastroc Stretch  Both;3 reps;30 seconds      Knee/Hip Exercises: Standing   Heel Raises  15 reps;Limitations    Knee Flexion  Left;10 reps    Forward Step Up  Left;10 reps;Hand Hold: 2;Step Height: 4"    Functional Squat  10 reps    Rocker Board  2 minutes    Rocker Board Limitations  Left and right    SLS  RT: 10  LT: 10  x 3 each       Knee/Hip Exercises: Supine   Quad Sets  Left;10 reps    Short Arc Quad Sets  Left;15 reps    Heel Slides  Left;10 reps    Knee Extension Limitations  5    Knee Flexion Limitations  110               PT Short Term Goals - 06/20/18 1520      PT SHORT TERM GOAL #1   Title  PT pain in his left knee to be no greater than a 5/10 so that he is not waking any greater than 2 x a night     Time  3    Period  Weeks    Status  On-going       PT SHORT TERM GOAL #2   Title  PT to be able to bend his left knee to 100 degrees to allow him to sit for 30 minutes in comfort to meals.    Time  3    Period  Weeks    Status  On-going      PT SHORT TERM GOAL #3   Title  PT left knee extension to be five degrees or less to normalize gait.     Baseline  06/20/18: Left knee extension 7 degrees this session    Time  3    Period  Weeks    Status  On-going      PT SHORT TERM GOAL #4   Title  PT to be walking with a cane and to have increased his walking to at least 15 minutes without stopping.     Time  3    Period  Weeks    Status  On-going        PT Long Term Goals - 06/20/18 1520      PT LONG TERM GOAL #1   Title  PT pain in his left knee to be no greater than a 2/10 to allow pt to sleep throughout the night without waking due to pain.     Time  6    Period  Weeks    Status  On-going      PT LONG TERM GOAL #2   Title  PT left knee flexion to be to 120 degrees to allow pt to squat to pick something off of the floor .     Baseline  06/20/18: Patient's knee flexion at 96 degrees    Time  6    Period  Weeks    Status  On-going      PT LONG TERM GOAL #3   Title  PT left knee extension to be less than 3 degrees to allow normalized heel toe gait.     Time  6    Period  Weeks    Status  On-going      PT LONG TERM GOAL #4   Title  PT to be able to walk in comfort for up to 45 minutes for return to work duties.     Time  6    Period  Weeks    Status  On-going      PT LONG TERM GOAL #5   Title  PT mm strength in LT LE to be at least 4+/5 to be able to go up and down steps in a reciprocal manner with one hand hold x 10 steps     Time  6    Period  Weeks    Status  On-going      PT LONG TERM GOAL #6   Title  Pt to be able to single leg stance x 20 seconds on B LE to improve safety when walking on uneven terrain at work     Time  6    Period  Weeks    Status  On-going            Plan - 06/25/18 1505     Clinical Impression Statement  Added single leg stance to promote balance; step up for strength as ROM has improved to 5-110.  Pt needs to be reminded to keep in good posture and alignment while completing exercises.     Rehab Potential  Good    PT Frequency  3x / week    PT Duration  6 weeks    PT Treatment/Interventions  ADLs/Self Care Home Management;Cryotherapy;Gait training;Stair training;Functional mobility training;Therapeutic activities;Therapeutic exercise;Balance training;Neuromuscular re-education;Patient/family education;Manual lymph drainage;Manual techniques;Passive range of motion    PT Next Visit Plan  Continue ROM based exercises and hip strengthening.  Begin  tandem stance and increase to 6" step up next session.    PT Home Exercise Plan  QS, modified SLR, heelslide, hip abduction and sitting LAQ        Patient will benefit from skilled therapeutic intervention in order to improve the following deficits and impairments:  Abnormal gait, Decreased activity tolerance, Decreased balance, Decreased range of motion, Decreased mobility, Decreased strength, Difficulty walking, Decreased skin integrity, Pain, Impaired flexibility  Visit Diagnosis: Acute pain of left knee  Stiffness of left knee, not elsewhere classified  Difficulty in walking, not elsewhere classified     Problem List Patient Active Problem List   Diagnosis Date Noted  . Osteoarthritis of left knee 06/11/2018  . Osteoarthritis of right knee 09/27/2016  Virgina Organ, PT CLT [phone] 06/25/2018, 3:14 PM  [facility] [address] [place], [place], [address] Phone: [phone]   Fax:  [phone]  Name: Ricky Bonilla MRN: [id number] Date of Birth: [birth year]

## 2018-06-27 ENCOUNTER — Ambulatory Visit (HOSPITAL_COMMUNITY): Payer: PRIVATE HEALTH INSURANCE

## 2018-06-27 ENCOUNTER — Encounter (HOSPITAL_COMMUNITY): Payer: Self-pay

## 2018-06-27 DIAGNOSIS — R262 Difficulty in walking, not elsewhere classified: Secondary | ICD-10-CM

## 2018-06-27 DIAGNOSIS — M25661 Stiffness of right knee, not elsewhere classified: Secondary | ICD-10-CM

## 2018-06-27 DIAGNOSIS — M25561 Pain in right knee: Secondary | ICD-10-CM

## 2018-06-27 DIAGNOSIS — M25562 Pain in left knee: Secondary | ICD-10-CM | POA: Diagnosis not present

## 2018-06-27 DIAGNOSIS — M25662 Stiffness of left knee, not elsewhere classified: Secondary | ICD-10-CM

## 2018-06-27 NOTE — Therapy (Signed)
[place], [place], [address] Phone: [phone]   Fax:  [phone]  Physical Therapy Treatment  Patient Details  Name: Ricky Bonilla MRN: [id number] Date of Birth: [birth year] Referring Provider (PT): Rod Can    Encounter Date: 06/27/2018  PT End of Session - 06/27/18 1346    Visit Number  6    Number of Visits  18    Date for PT Re-Evaluation  07/16/18   Mini-reassess 07/07/18   Authorization Type  The health plan    Authorization Time Period  Cert: 3/23-->55/73/22    Authorization - Visit Number  6    Authorization - Number of Visits  60    PT Start Time  0254    PT Stop Time  1430    PT Time Calculation (min)  44 min    Activity Tolerance  Patient tolerated treatment well;No increased pain    Behavior During Therapy  WFL for tasks assessed/performed       Past Medical History:  Diagnosis Date  . Hypertension   . OA (osteoarthritis)    left knee  . Wears glasses   . Wears hearing aid in both ears   . Wears partial dentures    upper    Past Surgical History:  Procedure Laterality Date  . APPENDECTOMY  1979 approx.  . COLONOSCOPY N/A 08/07/2016   Procedure: COLONOSCOPY;  Surgeon: Aviva Signs, MD;  [facility];  Service: Gastroenterology;  Laterality: N/A;  . KNEE ARTHROPLASTY Right 09/27/2016   Procedure: RIGHT TOTAL KNEE ARTHROPLASTY WITH COMPUTER NAVIGATION;  Surgeon: Rod Can, MD;  Location: [facility];  Service: Orthopedics;  Laterality: Right;  Combined spinal/epidural+regional block  . KNEE ARTHROPLASTY Left 06/11/2018   Procedure: LEFT  TOTAL KNEE ARTHROPLASTY WITH COMPUTER NAVIGATION;  Surgeon: Rod Can, MD;  Location: [facility];  Service: Orthopedics;  Laterality: Left;  Needs RNFA  . ROTATOR CUFF REPAIR Bilateral 2011 approx.    There were no vitals filed for this visit.  Subjective Assessment - 06/27/18 1345    Subjective  Patient report doing home program but  feels more benefit coming to clinic. 4/10 pain today in the left knee muscles especially in the back. Waking about every 3-4 hours lately.     Pertinent History  OA, HTN     Limitations  Sitting;Standing;Walking;House hold activities;Lifting    How long can you sit comfortably?  15 minutes     How long can you stand comfortably?  25 minutes     How long can you walk comfortably?  Walking with a walker he has walked at the most for five minutes     Patient Stated Goals  less pain, better movement, better sleep, walk without the walker     Currently in Pain?  Yes    Pain Score  4     Pain Location  Knee    Pain Orientation  Left;Posterior    Pain Descriptors / Indicators  Aching    Pain Type  Surgical pain    Pain Onset  1 to 4 weeks ago                       [facility] Adult PT Treatment/Exercise - 06/27/18 0001      Knee/Hip Exercises: Stretches   Active Hamstring Stretch  Left;3 reps;30 seconds    Active Hamstring Stretch Limitations  supine wiht rope    Passive Hamstring Stretch  Left;3  reps;30 seconds    Passive Hamstring Stretch Limitations  On 12'' step inside // bars    Knee: Self-Stretch to increase Flexion  Left;10 seconds    Knee: Self-Stretch Limitations  10 reps    Gastroc Stretch  Both;3 reps;30 seconds   slantboard     Knee/Hip Exercises: Standing   Heel Raises  15 reps;Limitations    Knee Flexion  Left;10 reps    Forward Step Up  Left;10 reps;Hand Hold: 2;Step Height: 6"    Functional Squat  10 reps    Rocker Board  2 minutes    Rocker Board Limitations  Left and right      Knee/Hip Exercises: Supine   Quad Sets  Left;10 reps   5 sec holds   Short Arc [employer]  Left;15 reps   5 sec holds   Straight Leg Raises  Left;15 reps    Knee Extension  AROM    Knee Extension Limitations  5    Knee Flexion  AROM    Knee Flexion Limitations  116      Manual Therapy   Manual Therapy  Edema management    Manual therapy comments  completed separate from all  other aspects of treatement    Edema Management  retrograde massage          Balance Exercises - 06/27/18 1409      Balance Exercises: Standing   Tandem Stance  Eyes open;Intermittent upper extremity support;3 reps;30 secs          PT Short Term Goals - 06/27/18 1442      PT SHORT TERM GOAL #1   Title  PT pain in his left knee to be no greater than a 5/10 so that he is not waking any greater than 2 x a night     Time  3    Period  Weeks    Status  On-going      PT SHORT TERM GOAL #2   Title  PT to be able to bend his left knee to 100 degrees to allow him to sit for 30 minutes in comfort to meals.    Baseline  06/27/2018 - 116 degrees    Time  3    Period  Weeks    Status  Achieved      PT SHORT TERM GOAL #3   Title  PT left knee extension to be five degrees or less to normalize gait.     Baseline  06/20/18: Left knee extension 7 degrees this session    Time  3    Period  Weeks    Status  Partially Met      PT SHORT TERM GOAL #4   Title  PT to be walking with a cane and to have increased his walking to at least 15 minutes without stopping.     Baseline  06/27/2018 - amb with SPC    Time  3    Period  Weeks    Status  Partially Met        PT Long Term Goals - 06/27/18 1443      PT LONG TERM GOAL #1   Title  PT pain in his left knee to be no greater than a 2/10 to allow pt to sleep throughout the night without waking due to pain.     Time  6    Period  Weeks    Status  On-going      PT LONG TERM GOAL #2  Title  PT left knee flexion to be to 120 degrees to allow pt to squat to pick something off of the floor .     Baseline  06/20/18: Patient's knee flexion at 96 degrees    Time  6    Period  Weeks    Status  On-going      PT LONG TERM GOAL #3   Title  PT left knee extension to be less than 3 degrees to allow normalized heel toe gait.     Time  6    Period  Weeks    Status  On-going      PT LONG TERM GOAL #4   Title  PT to be able to walk in comfort  for up to 45 minutes for return to work duties.     Time  6    Period  Weeks    Status  On-going      PT LONG TERM GOAL #5   Title  PT mm strength in LT LE to be at least 4+/5 to be able to go up and down steps in a reciprocal manner with one hand hold x 10 steps     Time  6    Period  Weeks    Status  On-going      PT LONG TERM GOAL #6   Title  Pt to be able to single leg stance x 20 seconds on B LE to improve safety when walking on uneven terrain at work     Time  6    Period  Weeks    Status  On-going            Plan - 06/27/18 1346    Clinical Impression Statement  Session focused on ROM. Advanced forward step ups to 6". Retrograde manual therapy for edema management. AROM went from 5-110 to 5-116 at end of session. Patient required one sitting rest break due to fatigue. Verbal cues to complete quad set prior to straight leg raise into flexion. Altered balance training to tandem stance balance on solid surface. May trial foam/compliant surface next session. Added to HEP - supine and standing hamstring stretch and supine SLR into flexion with reminders to perform quad set prior to leg lift. Continue with current plan, progress as able, review HEP to ensure compliance and proper performance.     Rehab Potential  Good    PT Frequency  3x / week    PT Duration  6 weeks    PT Treatment/Interventions  ADLs/Self Care Home Management;Cryotherapy;Gait training;Stair training;Functional mobility training;Therapeutic activities;Therapeutic exercise;Balance training;Neuromuscular re-education;Patient/family education;Manual lymph drainage;Manual techniques;Passive range of motion    PT Next Visit Plan  Continue ROM based exercises and hip strengthening. Consider tandem stance balance on foam. Focus on knee extension ROM and VMO strength and control for open and closed kinetic chain activities.     PT Home Exercise Plan  QS, modified SLR, heelslide, hip abduction and sitting LAQ; 06/27/2018 -  supine and standing hamstring stretch, SLR - flexion    Consulted and Agree with Plan of Care  Patient       Patient will benefit from skilled therapeutic intervention in order to improve the following deficits and impairments:  Abnormal gait, Decreased activity tolerance, Decreased balance, Decreased range of motion, Decreased mobility, Decreased strength, Difficulty walking, Decreased skin integrity, Pain, Impaired flexibility  Visit Diagnosis: Acute pain of left knee  Stiffness of left knee, not elsewhere classified  Difficulty in walking, not elsewhere classified  Acute pain of right knee  Stiffness of right knee, not elsewhere classified     Problem List Patient Active Problem List   Diagnosis Date Noted  . Osteoarthritis of left knee 06/11/2018  . Osteoarthritis of right knee 09/27/2016    Floria Raveling. Hartnett-Rands, MS, PT [facility] #[id number] 06/27/2018, 2:43 PM  [facility] [address] [place], [place], [address] Phone: [phone]   Fax:  [phone]  Name: Ricky Bonilla MRN: [id number] Date of Birth: 1951/06/10

## 2018-06-27 NOTE — Patient Instructions (Signed)
Hamstring Stretch: Active    Support behind right knee. Starting with knee bent, attempt to straighten knee until a comfortable stretch is felt in back of thigh. Hold _30___ seconds. Repeat __3__ times per set. Do _1___ sets per session. Do __1__ sessions per day.  http://orth.exer.us/158   Copyright  VHI. All rights reserved.   Hamstring Stretch    Stand with one heel on step, knee straight. Lean forward from hip. Hold 30___ seconds. Perform _3__ reps.  Copyright  VHI. All rights reserved.    Straight Leg Raise    Tighten stomach and slowly raise locked rleft leg _9___ inches from floor. Repeat _10-15___ times per set. 3 second hold. Do __1__ sets per session. Do _1___ sessions per day.  http://orth.exer.us/1102   Copyright  VHI. All rights reserved.

## 2018-06-30 ENCOUNTER — Encounter

## 2018-06-30 ENCOUNTER — Encounter (HOSPITAL_COMMUNITY): Payer: Self-pay

## 2018-06-30 ENCOUNTER — Ambulatory Visit (HOSPITAL_COMMUNITY): Payer: PRIVATE HEALTH INSURANCE

## 2018-06-30 DIAGNOSIS — M25662 Stiffness of left knee, not elsewhere classified: Secondary | ICD-10-CM

## 2018-06-30 DIAGNOSIS — M25562 Pain in left knee: Secondary | ICD-10-CM | POA: Diagnosis not present

## 2018-06-30 DIAGNOSIS — R262 Difficulty in walking, not elsewhere classified: Secondary | ICD-10-CM

## 2018-06-30 NOTE — Therapy (Signed)
[place], [place], [address] Phone: [phone]   Fax:  [phone]  Physical Therapy Treatment  Patient Details  Name: Ricky Bonilla MRN: [id number] Date of Birth: 11/17/1950 Referring Provider (PT): Rod Can    Encounter Date: 06/30/2018  PT End of Session - 06/30/18 1426    Visit Number  7    Number of Visits  18    Date for PT Re-Evaluation  07/16/18   Mini-reassess 07/07/18   Authorization Type  The health plan    Authorization Time Period  Cert: 9/98-->33/82/50    Authorization - Visit Number  7    Authorization - Number of Visits  60    PT Start Time  5397    PT Stop Time  1509    PT Time Calculation (min)  43 min    Activity Tolerance  Patient tolerated treatment well;No increased pain    Behavior During Therapy  WFL for tasks assessed/performed       Past Medical History:  Diagnosis Date  . Hypertension   . OA (osteoarthritis)    left knee  . Wears glasses   . Wears hearing aid in both ears   . Wears partial dentures    upper    Past Surgical History:  Procedure Laterality Date  . APPENDECTOMY  1979 approx.  . COLONOSCOPY N/A 08/07/2016   Procedure: COLONOSCOPY;  Surgeon: Aviva Signs, MD;  [facility];  Service: Gastroenterology;  Laterality: N/A;  . KNEE ARTHROPLASTY Right 09/27/2016   Procedure: RIGHT TOTAL KNEE ARTHROPLASTY WITH COMPUTER NAVIGATION;  Surgeon: Rod Can, MD;  Location: [facility];  Service: Orthopedics;  Laterality: Right;  Combined spinal/epidural+regional block  . KNEE ARTHROPLASTY Left 06/11/2018   Procedure: LEFT  TOTAL KNEE ARTHROPLASTY WITH COMPUTER NAVIGATION;  Surgeon: Rod Can, MD;  Location: [facility];  Service: Orthopedics;  Laterality: Left;  Needs RNFA  . ROTATOR CUFF REPAIR Bilateral 2011 approx.    There were no vitals filed for this visit.  Subjective Assessment - 06/30/18 1426    Subjective  Pt states that he's feeling pretty  good today. Reports his knee pain is currently about a 4/10.     Pertinent History  OA, HTN     Limitations  Sitting;Standing;Walking;House hold activities;Lifting    How long can you sit comfortably?  15 minutes     How long can you stand comfortably?  25 minutes     How long can you walk comfortably?  Walking with a walker he has walked at the most for five minutes     Patient Stated Goals  less pain, better movement, better sleep, walk without the walker     Currently in Pain?  Yes    Pain Score  4     Pain Location  Knee    Pain Orientation  Left    Pain Descriptors / Indicators  Aching    Pain Type  Surgical pain    Pain Onset  1 to 4 weeks ago    Pain Frequency  Intermittent    Aggravating Factors   full rnage    Pain Relieving Factors  rest, medication, ice    Effect of Pain on Daily Activities  limits            [facility] Adult PT Treatment/Exercise - 06/30/18 0001      Exercises   Exercises  Knee/Hip      Knee/Hip Exercises: Stretches   Passive Hamstring  Stretch  Left;3 reps;30 seconds    Passive Hamstring Stretch Limitations  standing, 12" step    Knee: Self-Stretch to increase Flexion  Left;10 seconds    Knee: Self-Stretch Limitations  10 reps, 12" step    Gastroc Stretch  Both;3 reps;30 seconds    Gastroc Stretch Limitations  slant board      Knee/Hip Exercises: Standing   Heel Raises  Both;20 reps    Heel Raises Limitations  heel and toe on incline    Forward Lunges  Both;10 reps    Forward Lunges Limitations  4" step    Terminal Knee Extension  Left;10 reps    Theraband Level (Terminal Knee Extension)  Level 3 (Green)    Terminal Knee Extension Limitations  10" holds    Lateral Step Up  Left;15 reps;Step Height: 4";Hand Hold: 2    Forward Step Up  Left;15 reps;Step Height: 4";Hand Hold: 2      Knee/Hip Exercises: Seated   Long Arc Quad  Left;10 reps      Knee/Hip Exercises: Supine   Short Arc Quad Sets  Left;20 reps    Short Arc Quad Sets Limitations   basketball, 2-3" holds    Knee Extension Limitations  6    Knee Flexion Limitations  118      Manual Therapy   Manual Therapy  Edema management;Joint mobilization    Manual therapy comments  completed separate from all other aspects of treatement    Edema Management  retrograde massage with BLE elevated for swelling and pain    Joint Mobilization  patellar mobs all directions for mobility             PT Education - 06/30/18 1430    Education Details  exercise technique, continue HEP    Person(s) Educated  Patient    Methods  Explanation;Demonstration    Comprehension  Verbalized understanding;Returned demonstration       PT Short Term Goals - 06/27/18 1442      PT SHORT TERM GOAL #1   Title  PT pain in his left knee to be no greater than a 5/10 so that he is not waking any greater than 2 x a night     Time  3    Period  Weeks    Status  On-going      PT SHORT TERM GOAL #2   Title  PT to be able to bend his left knee to 100 degrees to allow him to sit for 30 minutes in comfort to meals.    Baseline  06/27/2018 - 116 degrees    Time  3    Period  Weeks    Status  Achieved      PT SHORT TERM GOAL #3   Title  PT left knee extension to be five degrees or less to normalize gait.     Baseline  06/20/18: Left knee extension 7 degrees this session    Time  3    Period  Weeks    Status  Partially Met      PT SHORT TERM GOAL #4   Title  PT to be walking with a cane and to have increased his walking to at least 15 minutes without stopping.     Baseline  06/27/2018 - amb with SPC    Time  3    Period  Weeks    Status  Partially Met        PT Long Term Goals - 06/27/18 1443  PT LONG TERM GOAL #1   Title  PT pain in his left knee to be no greater than a 2/10 to allow pt to sleep throughout the night without waking due to pain.     Time  6    Period  Weeks    Status  On-going      PT LONG TERM GOAL #2   Title  PT left knee flexion to be to 120 degrees to allow pt  to squat to pick something off of the floor .     Baseline  06/20/18: Patient's knee flexion at 96 degrees    Time  6    Period  Weeks    Status  On-going      PT LONG TERM GOAL #3   Title  PT left knee extension to be less than 3 degrees to allow normalized heel toe gait.     Time  6    Period  Weeks    Status  On-going      PT LONG TERM GOAL #4   Title  PT to be able to walk in comfort for up to 45 minutes for return to work duties.     Time  6    Period  Weeks    Status  On-going      PT LONG TERM GOAL #5   Title  PT mm strength in LT LE to be at least 4+/5 to be able to go up and down steps in a reciprocal manner with one hand hold x 10 steps     Time  6    Period  Weeks    Status  On-going      PT LONG TERM GOAL #6   Title  Pt to be able to single leg stance x 20 seconds on B LE to improve safety when walking on uneven terrain at work     Time  6    Period  Weeks    Status  On-going            Plan - 06/30/18 1509    Clinical Impression Statement  Continued with established POC focusing on L knee mobility and overall strength. Introduced him to [employer] with theraband in order to address last bit of remaining extension. Also progressed him to lateral step ups, lunging, and hip abd for quad and hip strengthening since his AROM has started to normalize. Pt tolerating all well, only requiring min cues for proper technique. Ended with manual for edema and pain control as well as patellar mobs for pain control and mobility. AROM 6 to 118deg this date.     Rehab Potential  Good    PT Frequency  3x / week    PT Duration  6 weeks    PT Treatment/Interventions  ADLs/Self Care Home Management;Cryotherapy;Gait training;Stair training;Functional mobility training;Therapeutic activities;Therapeutic exercise;Balance training;Neuromuscular re-education;Patient/family education;Manual lymph drainage;Manual techniques;Passive range of motion    PT Next Visit Plan  Continue ROM based  exercises and hip strengthening. continue TKE; Consider tandem stance balance on foam. Focus on knee extension ROM and VMO strength and control for open and closed kinetic chain activities.     PT Home Exercise Plan  QS, modified SLR, heelslide, hip abduction and sitting LAQ; 06/27/2018 - supine and standing hamstring stretch, SLR - flexion    Consulted and Agree with Plan of Care  Patient       Patient will benefit from skilled therapeutic intervention in order to improve the following  deficits and impairments:  Abnormal gait, Decreased activity tolerance, Decreased balance, Decreased range of motion, Decreased mobility, Decreased strength, Difficulty walking, Decreased skin integrity, Pain, Impaired flexibility  Visit Diagnosis: Acute pain of left knee  Stiffness of left knee, not elsewhere classified  Difficulty in walking, not elsewhere classified     Problem List Patient Active Problem List   Diagnosis Date Noted  . Osteoarthritis of left knee 06/11/2018  . Osteoarthritis of right knee 09/27/2016       Geraldine Solar PT, DPT  [facility] [address] [place], [place], [address] Phone: [phone]   Fax:  [phone]  Name: DEMETRI GOSHERT MRN: [id number] Date of Birth: Nov 17, 1950

## 2018-07-02 ENCOUNTER — Ambulatory Visit (HOSPITAL_COMMUNITY): Payer: PRIVATE HEALTH INSURANCE | Attending: Orthopedic Surgery | Admitting: Physical Therapy

## 2018-07-02 ENCOUNTER — Other Ambulatory Visit: Payer: Self-pay

## 2018-07-02 DIAGNOSIS — M25662 Stiffness of left knee, not elsewhere classified: Secondary | ICD-10-CM | POA: Diagnosis present

## 2018-07-02 DIAGNOSIS — M25661 Stiffness of right knee, not elsewhere classified: Secondary | ICD-10-CM | POA: Diagnosis present

## 2018-07-02 DIAGNOSIS — R262 Difficulty in walking, not elsewhere classified: Secondary | ICD-10-CM | POA: Diagnosis present

## 2018-07-02 DIAGNOSIS — M25562 Pain in left knee: Secondary | ICD-10-CM | POA: Diagnosis not present

## 2018-07-02 DIAGNOSIS — M25561 Pain in right knee: Secondary | ICD-10-CM | POA: Diagnosis present

## 2018-07-02 NOTE — Therapy (Signed)
[place] [place], [place], [address] Phone: [phone]   Fax:  [phone]  Physical Therapy Treatment  Patient Details  Name: Ricky Bonilla MRN: [id number] Date of Birth: 1950/10/03 Referring Provider (PT): Rod Can    Encounter Date: 07/02/2018  PT End of Session - 07/02/18 1448    Visit Number  8    Number of Visits  18    Date for PT Re-Evaluation  07/16/18   Mini-reassess 07/07/18   Authorization Type  The health plan    Authorization Time Period  Cert: 9/92-->42/68/34    Authorization - Visit Number  7    Authorization - Number of Visits  60    PT Start Time  1962    PT Stop Time  1520    PT Time Calculation (min)  43 min    Activity Tolerance  Patient tolerated treatment well;No increased pain    Behavior During Therapy  WFL for tasks assessed/performed       Past Medical History:  Diagnosis Date  . Hypertension   . OA (osteoarthritis)    left knee  . Wears glasses   . Wears hearing aid in both ears   . Wears partial dentures    upper    Past Surgical History:  Procedure Laterality Date  . APPENDECTOMY  1979 approx.  . COLONOSCOPY N/A 08/07/2016   Procedure: COLONOSCOPY;  Surgeon: Aviva Signs, MD;  [facility];  Service: Gastroenterology;  Laterality: N/A;  . KNEE ARTHROPLASTY Right 09/27/2016   Procedure: RIGHT TOTAL KNEE ARTHROPLASTY WITH COMPUTER NAVIGATION;  Surgeon: Rod Can, MD;  Location: [facility];  Service: Orthopedics;  Laterality: Right;  Combined spinal/epidural+regional block  . KNEE ARTHROPLASTY Left 06/11/2018   Procedure: LEFT  TOTAL KNEE ARTHROPLASTY WITH COMPUTER NAVIGATION;  Surgeon: Rod Can, MD;  Location: [facility];  Service: Orthopedics;  Laterality: Left;  Needs RNFA  . ROTATOR CUFF REPAIR Bilateral 2011 approx.    There were no vitals filed for this visit.  Subjective Assessment - 07/02/18 1439    Subjective  PT states     Pertinent History  OA,  HTN     Limitations  Sitting;Standing;Walking;House hold activities;Lifting    How long can you sit comfortably?  15 minutes     How long can you stand comfortably?  25 minutes     How long can you walk comfortably?  Walking with a walker he has walked at the most for five minutes     Patient Stated Goals  less pain, better movement, better sleep, walk without the walker     Currently in Pain?  Yes    Pain Score  4     Pain Location  Knee    Pain Orientation  Right    Pain Descriptors / Indicators  Aching;Sore;Throbbing    Pain Type  Acute pain    Pain Onset  1 to 4 weeks ago    Pain Frequency  Constant    Aggravating Factors   walking     Pain Relieving Factors  ice     Effect of Pain on Daily Activities  limits                        [facility] Adult PT Treatment/Exercise - 07/02/18 0001      Exercises   Exercises  Knee/Hip      Knee/Hip Exercises: Stretches   Passive Hamstring Stretch  Left;3 reps;30  seconds    Knee: Self-Stretch to increase Flexion  Left;10 seconds    Knee: Self-Stretch Limitations  second step     Gastroc Stretch  Both;3 reps;30 seconds    Gastroc Stretch Limitations  slant board      Knee/Hip Exercises: Standing   Heel Raises  Both;15 reps    Terminal Knee Extension  Left;10 reps    Rocker Board  2 minutes    SLS  RT: 30"  LT: 18  x 3 each       Knee/Hip Exercises: Seated   Long Arc Quad  10 reps      Knee/Hip Exercises: Supine   Quad Sets  Left;10 reps   5 sec holds   Short Arc [employer]  Left;15 reps   5 sec holds     Modalities   Modalities  Moist Heat      Moist Heat Therapy   Number Minutes Moist Heat  5 Minutes    Moist Heat Location  --   distal hamstring      Manual Therapy   Manual Therapy  Edema management;Joint mobilization    Manual therapy comments  completed separate from all other aspects of treatement    Edema Management  retrograde massage with BLE elevated for swelling and pain    Joint Mobilization   patellar mobs all directions for mobility               PT Short Term Goals - 06/27/18 1442      PT SHORT TERM GOAL #1   Title  PT pain in his left knee to be no greater than a 5/10 so that he is not waking any greater than 2 x a night     Time  3    Period  Weeks    Status  On-going      PT SHORT TERM GOAL #2   Title  PT to be able to bend his left knee to 100 degrees to allow him to sit for 30 minutes in comfort to meals.    Baseline  06/27/2018 - 116 degrees    Time  3    Period  Weeks    Status  Achieved      PT SHORT TERM GOAL #3   Title  PT left knee extension to be five degrees or less to normalize gait.     Baseline  06/20/18: Left knee extension 7 degrees this session    Time  3    Period  Weeks    Status  Partially Met      PT SHORT TERM GOAL #4   Title  PT to be walking with a cane and to have increased his walking to at least 15 minutes without stopping.     Baseline  06/27/2018 - amb with SPC    Time  3    Period  Weeks    Status  Partially Met        PT Long Term Goals - 06/27/18 1443      PT LONG TERM GOAL #1   Title  PT pain in his left knee to be no greater than a 2/10 to allow pt to sleep throughout the night without waking due to pain.     Time  6    Period  Weeks    Status  On-going      PT LONG TERM GOAL #2   Title  PT left knee flexion to be to 120 degrees  to allow pt to squat to pick something off of the floor .     Baseline  06/20/18: Patient's knee flexion at 96 degrees    Time  6    Period  Weeks    Status  On-going      PT LONG TERM GOAL #3   Title  PT left knee extension to be less than 3 degrees to allow normalized heel toe gait.     Time  6    Period  Weeks    Status  On-going      PT LONG TERM GOAL #4   Title  PT to be able to walk in comfort for up to 45 minutes for return to work duties.     Time  6    Period  Weeks    Status  On-going      PT LONG TERM GOAL #5   Title  PT mm strength in LT LE to be at least 4+/5  to be able to go up and down steps in a reciprocal manner with one hand hold x 10 steps     Time  6    Period  Weeks    Status  On-going      PT LONG TERM GOAL #6   Title  Pt to be able to single leg stance x 20 seconds on B LE to improve safety when walking on uneven terrain at work     Time  6    Period  Weeks    Status  On-going            Plan - 07/02/18 1448    Clinical Impression Statement  At end of session pt hamstring continued to cramp.  Therapist attempted manual with no relief.  PT unable to tolerate lying supine due to hamstring cramping.  No swelling in posterior aspect of knee therefore PT placed HMP on posterior knee in sidelying postion.  Overall pt imporing; he is walking with a cane inside and out of his house.  He is able to go up and down steps in a reciprocal manner with one hand hold at this time.  PT continues to have decreased knee extension with deviates his gait therefore this will continue to be our main focus.     Rehab Potential  Good    PT Frequency  3x / week    PT Duration  6 weeks    PT Treatment/Interventions  ADLs/Self Care Home Management;Cryotherapy;Gait training;Stair training;Functional mobility training;Therapeutic activities;Therapeutic exercise;Balance training;Neuromuscular re-education;Patient/family education;Manual lymph drainage;Manual techniques;Passive range of motion    PT Next Visit Plan  Continue ROM based exercises and hip strengthening.. Focus on knee extension ROM and VMO strength and control for open and closed kinetic chain activities.     PT Home Exercise Plan  QS, modified SLR, heelslide, hip abduction and sitting LAQ; 06/27/2018 - supine and standing hamstring stretch, SLR - flexion    Consulted and Agree with Plan of Care  Patient       Patient will benefit from skilled therapeutic intervention in order to improve the following deficits and impairments:  Abnormal gait, Decreased activity tolerance, Decreased balance,  Decreased range of motion, Decreased mobility, Decreased strength, Difficulty walking, Decreased skin integrity, Pain, Impaired flexibility  Visit Diagnosis: Acute pain of left knee  Stiffness of left knee, not elsewhere classified  Difficulty in walking, not elsewhere classified  Acute pain of right knee  Stiffness of right knee, not elsewhere classified  Problem List Patient Active Problem List   Diagnosis Date Noted  . Osteoarthritis of left knee 06/11/2018  . Osteoarthritis of right knee 09/27/2016    Rayetta Humphrey, PT CLT [phone] 07/02/2018, 3:20 PM  [facility] [address] [place], [place], [address] Phone: [phone]   Fax:  [phone]  Name: Ricky Bonilla MRN: [id number] Date of Birth: [birth year]

## 2018-07-04 ENCOUNTER — Ambulatory Visit (HOSPITAL_COMMUNITY): Payer: PRIVATE HEALTH INSURANCE | Admitting: Physical Therapy

## 2018-07-04 ENCOUNTER — Encounter (HOSPITAL_COMMUNITY): Payer: Self-pay | Admitting: Physical Therapy

## 2018-07-04 DIAGNOSIS — M25662 Stiffness of left knee, not elsewhere classified: Secondary | ICD-10-CM

## 2018-07-04 DIAGNOSIS — M25562 Pain in left knee: Secondary | ICD-10-CM

## 2018-07-04 DIAGNOSIS — R262 Difficulty in walking, not elsewhere classified: Secondary | ICD-10-CM

## 2018-07-04 NOTE — Therapy (Signed)
[facility] [place], [place], [address] Phone: [phone]   Fax:  [phone]  Physical Therapy Treatment  Patient Details  Name: Ricky Bonilla MRN: [id number] Date of Birth: Sep 28, 1951 Referring Provider (PT): Rod Can    Encounter Date: 07/04/2018  PT End of Session - 07/04/18 1301    Visit Number  9    Number of Visits  18    Date for PT Re-Evaluation  07/16/18   Mini-reassess 07/07/18   Authorization Type  The health plan    Authorization Time Period  Cert: 6/71-->24/58/09    Authorization - Visit Number  9   Corrected count   Authorization - Number of Visits  60    PT Start Time  1300    PT Stop Time  1344    PT Time Calculation (min)  44 min    Activity Tolerance  Patient tolerated treatment well;No increased pain    Behavior During Therapy  WFL for tasks assessed/performed       Past Medical History:  Diagnosis Date  . Hypertension   . OA (osteoarthritis)    left knee  . Wears glasses   . Wears hearing aid in both ears   . Wears partial dentures    upper    Past Surgical History:  Procedure Laterality Date  . APPENDECTOMY  1979 approx.  . COLONOSCOPY N/A 08/07/2016   Procedure: COLONOSCOPY;  Surgeon: Aviva Signs, MD;  [facility];  Service: Gastroenterology;  Laterality: N/A;  . KNEE ARTHROPLASTY Right 09/27/2016   Procedure: RIGHT TOTAL KNEE ARTHROPLASTY WITH COMPUTER NAVIGATION;  Surgeon: Rod Can, MD;  Location: [facility];  Service: Orthopedics;  Laterality: Right;  Combined spinal/epidural+regional block  . KNEE ARTHROPLASTY Left 06/11/2018   Procedure: LEFT  TOTAL KNEE ARTHROPLASTY WITH COMPUTER NAVIGATION;  Surgeon: Rod Can, MD;  Location: [facility];  Service: Orthopedics;  Laterality: Left;  Needs RNFA  . ROTATOR CUFF REPAIR Bilateral 2011 approx.    There were no vitals filed for this visit.  Subjective Assessment - 07/04/18 1300    Subjective  Patient reported he  has been doing his exercises at home.     Pertinent History  OA, HTN     Limitations  Sitting;Standing;Walking;House hold activities;Lifting    How long can you sit comfortably?  15 minutes     How long can you stand comfortably?  25 minutes     How long can you walk comfortably?  Walking with a walker he has walked at the most for five minutes     Patient Stated Goals  less pain, better movement, better sleep, walk without the walker     Currently in Pain?  No/denies                       [facility] Adult PT Treatment/Exercise - 07/04/18 0001      Exercises   Exercises  Knee/Hip      Knee/Hip Exercises: Stretches   Passive Hamstring Stretch  Left;3 reps;30 seconds    Passive Hamstring Stretch Limitations  standing, 12" step    Knee: Self-Stretch to increase Flexion  Left;10 seconds    Knee: Self-Stretch Limitations  on 12'' step    Gastroc Stretch  Both;3 reps;30 seconds    Gastroc Stretch Limitations  slant board      Knee/Hip Exercises: Standing   Heel Raises  Both;15 reps    Terminal Knee Extension  Left;10 reps  Theraband Level (Terminal Knee Extension)  Level 3 (Green)    Terminal Knee Extension Limitations  10" holds    Rocker Board  2 minutes    Rocker Board Limitations  Left and right    SLS  SLS x 5 left      Knee/Hip Exercises: Seated   Long Arc Quad  10 reps    Long Arc Quad Limitations  3 second holds    Sit to [employer]  2 sets;5 reps;without UE support      Knee/Hip Exercises: Supine   [employer]  Left;10 reps   5 second holds   Short Arc [employer]  Left;15 reps   5 second holds   Knee Extension  AROM    Knee Extension Limitations  5    Knee Flexion  AROM    Knee Flexion Limitations  116      Manual Therapy   Manual Therapy  Edema management;Joint mobilization    Manual therapy comments  completed separate from all other aspects of treatement    Edema Management  retrograde massage with BLE elevated for swelling and pain    Joint Mobilization   patellar mobs Grade III-IV all directions for mobility             PT Education - 07/04/18 1301    Education Details  Discussed purpose and technique of exercises throughout session.     Person(s) Educated  Patient    Methods  Explanation;Demonstration    Comprehension  Verbalized understanding       PT Short Term Goals - 06/27/18 1442      PT SHORT TERM GOAL #1   Title  PT pain in his left knee to be no greater than a 5/10 so that he is not waking any greater than 2 x a night     Time  3    Period  Weeks    Status  On-going      PT SHORT TERM GOAL #2   Title  PT to be able to bend his left knee to 100 degrees to allow him to sit for 30 minutes in comfort to meals.    Baseline  06/27/2018 - 116 degrees    Time  3    Period  Weeks    Status  Achieved      PT SHORT TERM GOAL #3   Title  PT left knee extension to be five degrees or less to normalize gait.     Baseline  06/20/18: Left knee extension 7 degrees this session    Time  3    Period  Weeks    Status  Partially Met      PT SHORT TERM GOAL #4   Title  PT to be walking with a cane and to have increased his walking to at least 15 minutes without stopping.     Baseline  06/27/2018 - amb with SPC    Time  3    Period  Weeks    Status  Partially Met        PT Long Term Goals - 06/27/18 1443      PT LONG TERM GOAL #1   Title  PT pain in his left knee to be no greater than a 2/10 to allow pt to sleep throughout the night without waking due to pain.     Time  6    Period  Weeks    Status  On-going      PT  LONG TERM GOAL #2   Title  PT left knee flexion to be to 120 degrees to allow pt to squat to pick something off of the floor .     Baseline  06/20/18: Patient's knee flexion at 96 degrees    Time  6    Period  Weeks    Status  On-going      PT LONG TERM GOAL #3   Title  PT left knee extension to be less than 3 degrees to allow normalized heel toe gait.     Time  6    Period  Weeks    Status  On-going       PT LONG TERM GOAL #4   Title  PT to be able to walk in comfort for up to 45 minutes for return to work duties.     Time  6    Period  Weeks    Status  On-going      PT LONG TERM GOAL #5   Title  PT mm strength in LT LE to be at least 4+/5 to be able to go up and down steps in a reciprocal manner with one hand hold x 10 steps     Time  6    Period  Weeks    Status  On-going      PT LONG TERM GOAL #6   Title  Pt to be able to single leg stance x 20 seconds on B LE to improve safety when walking on uneven terrain at work     Time  6    Period  Weeks    Status  On-going            Plan - 07/04/18 1349    Clinical Impression Statement  This session continued with established plan of care. This session added sit to stands to work on functional strengthening. This session patient's AROM of his left knee ranged from 5 degrees to 116. Patient would continue to benefit from continued skilled physical therapy in order to continue progressing towards functional goals.     Rehab Potential  Good    PT Frequency  3x / week    PT Duration  6 weeks    PT Treatment/Interventions  ADLs/Self Care Home Management;Cryotherapy;Gait training;Stair training;Functional mobility training;Therapeutic activities;Therapeutic exercise;Balance training;Neuromuscular re-education;Patient/family education;Manual lymph drainage;Manual techniques;Passive range of motion    PT Next Visit Plan  Continue ROM based exercises and hip strengthening.. Focus on knee extension ROM and VMO strength and control for open and closed kinetic chain activities.     PT Home Exercise Plan  QS, modified SLR, heelslide, hip abduction and sitting LAQ; 06/27/2018 - supine and standing hamstring stretch, SLR - flexion    Consulted and Agree with Plan of Care  Patient       Patient will benefit from skilled therapeutic intervention in order to improve the following deficits and impairments:  Abnormal gait, Decreased activity  tolerance, Decreased balance, Decreased range of motion, Decreased mobility, Decreased strength, Difficulty walking, Decreased skin integrity, Pain, Impaired flexibility  Visit Diagnosis: Acute pain of left knee  Stiffness of left knee, not elsewhere classified  Difficulty in walking, not elsewhere classified     Problem List Patient Active Problem List   Diagnosis Date Noted  . Osteoarthritis of left knee 06/11/2018  . Osteoarthritis of right knee 09/27/2016   Clarene Critchley PT, DPT 1:53 PM, 07/04/18 [place],  [place], [address] Phone: [phone]   Fax:  [phone]  Name: Ricky Bonilla MRN: [id number] Date of Birth: [birth year]

## 2018-07-07 ENCOUNTER — Encounter (HOSPITAL_COMMUNITY): Payer: Self-pay | Admitting: Physical Therapy

## 2018-07-07 ENCOUNTER — Ambulatory Visit (HOSPITAL_COMMUNITY): Payer: PRIVATE HEALTH INSURANCE | Admitting: Physical Therapy

## 2018-07-07 DIAGNOSIS — M25661 Stiffness of right knee, not elsewhere classified: Secondary | ICD-10-CM

## 2018-07-07 DIAGNOSIS — M25561 Pain in right knee: Secondary | ICD-10-CM

## 2018-07-07 DIAGNOSIS — M25562 Pain in left knee: Secondary | ICD-10-CM | POA: Diagnosis not present

## 2018-07-07 DIAGNOSIS — M25662 Stiffness of left knee, not elsewhere classified: Secondary | ICD-10-CM

## 2018-07-07 DIAGNOSIS — R262 Difficulty in walking, not elsewhere classified: Secondary | ICD-10-CM

## 2018-07-07 NOTE — Therapy (Addendum)
[facility] [address] [place], [place], [address] Phone: [phone]   Fax:  [phone]  Physical Therapy Treatment  Patient Details  Name: Ricky Bonilla MRN: [id number] Date of Birth: [birth year] Referring Provider (PT): Rod Can   Progress Note Reporting Period 06/16/2018  to 07/07/2018  See note below for Objective Data and Assessment of Progress/Goals.      Encounter Date: 07/07/2018  PT End of Session - 07/07/18 1503    Visit Number  10    Number of Visits  18    Date for PT Re-Evaluation  07/16/18   Mini-reassess 07/07/18   Authorization Type  The health plan    Authorization Time Period  Cert: 7/35-->32/99/24    Authorization - Visit Number  10   Corrected count   Authorization - Number of Visits  60    PT Start Time  1430    PT Stop Time  1510    PT Time Calculation (min)  40 min    Activity Tolerance  Patient tolerated treatment well;No increased pain    Behavior During Therapy  WFL for tasks assessed/performed       Past Medical History:  Diagnosis Date  . Hypertension   . OA (osteoarthritis)    left knee  . Wears glasses   . Wears hearing aid in both ears   . Wears partial dentures    upper    Past Surgical History:  Procedure Laterality Date  . APPENDECTOMY  1979 approx.  . COLONOSCOPY N/A 08/07/2016   Procedure: COLONOSCOPY;  Surgeon: Aviva Signs, MD;  [facility];  Service: Gastroenterology;  Laterality: N/A;  . KNEE ARTHROPLASTY Right 09/27/2016   Procedure: RIGHT TOTAL KNEE ARTHROPLASTY WITH COMPUTER NAVIGATION;  Surgeon: Rod Can, MD;  Location: [facility];  Service: Orthopedics;  Laterality: Right;  Combined spinal/epidural+regional block  . KNEE ARTHROPLASTY Left 06/11/2018   Procedure: LEFT  TOTAL KNEE ARTHROPLASTY WITH COMPUTER NAVIGATION;  Surgeon: Rod Can, MD;  Location: [facility];  Service: Orthopedics;  Laterality: Left;  Needs RNFA  . ROTATOR CUFF REPAIR Bilateral  2011 approx.    There were no vitals filed for this visit.  Subjective Assessment - 07/07/18 1454    Subjective  PT states that he seems to be doing better everyday     Pertinent History  OA, HTN     Limitations  Sitting;Standing;Walking;House hold activities;Lifting    How long can you sit comfortably?  20 minutes was 15 minutes     How long can you stand comfortably?  25 minutes was 25 minutes     How long can you walk comfortably?  Pt walking without assistive device for about 15 mintues was Walking with a walker he has walked at the most for five minutes     Patient Stated Goals  less pain, better movement, better sleep, walk without the walker     Currently in Pain?  Yes    Pain Score  2     Pain Location  Knee    Pain Orientation  Left    Pain Descriptors / Indicators  Aching    Pain Onset  1 to 4 weeks ago    Pain Frequency  Intermittent    Aggravating Factors   full range     Pain Relieving Factors  meds     Effect of Pain on Daily Activities  limits          [facility] PT Assessment - 07/07/18  0001      Assessment   Medical Diagnosis  Lt TKR    Referring Provider (PT)  Aaron Edelman Swinteck     Onset Date/Surgical Date  06/11/18    Prior Therapy  acute      Precautions   Precautions  None      Restrictions   Weight Bearing Restrictions  No      [place] residence      Prior Function   Level of Independence  Independent    Vocation  Full time employment    Vocation Requirements  On feet all day : [employer] [place]  no time       Cognition   Overall Cognitive Status  Within Functional Limits for tasks assessed      Observation/Other Assessments   Focus on Therapeutic Outcomes (FOTO)   62   was 32      Observation/Other Assessments-Edema    Edema  Circumferential      Circumferential Edema   Circumferential - Right  40 cm     Circumferential - Left   mid patella 44.3 cm was 45       Functional Tests   Functional  tests  Single leg stance;Sit to Stand      Single Leg Stance   Comments  RT: 60 was  3 seconds Lt 50 was 0      Sit to Stand   Comments  5 x 14.7; initally was unable       AROM   Left Knee Extension 5 was  9    Left Knee Flexion 120 was 75      Strength   Right Hip Extension  5/5    Right Hip ABduction  5/5    Left Hip Flexion  5/5   was 2/5    Left Hip Extension  4/5    Left Hip ABduction  5/5   was 4+/5    Right Knee Flexion  5/5    Right Knee Extension  5/5    Left Knee Flexion  4/5    Left Knee Extension  4+/5   was 3-   Right Ankle Dorsiflexion  5/5    Left Ankle Dorsiflexion  5/5                   [facility] Adult PT Treatment/Exercise - 07/07/18 0001      Exercises   Exercises  Knee/Hip      Knee/Hip Exercises: Stretches   Active Hamstring Stretch  Left;3 reps;30 seconds    Gastroc Stretch  Both;3 reps;30 seconds    Gastroc Stretch Limitations  slant board      Knee/Hip Exercises: Standing   Heel Raises  Both;15 reps    SLS  SLS x 5 left      Knee/Hip Exercises: Supine   Quad Sets  Left;10 reps   5 second holds   Short [employer]  Left;15 reps   5 second holds   Knee Extension  AROM    Knee Extension Limitations  5    Knee Flexion  AROM    Knee Flexion Limitations  120               PT Short Term Goals - 07/07/18 1449      PT SHORT TERM GOAL #1   Title  PT pain in his left knee to be no greater than a 5/10 so that he  is not waking any greater than 2 x a night     Time  3    Period  Weeks    Status  Partially Met   waking twice a night now but pain goes as high as a 6/10      PT SHORT TERM GOAL #2   Title  PT to be able to bend his left knee to 100 degrees to allow him to sit for 30 minutes in comfort to meals.    Baseline  06/27/2018 - 116 degrees    Time  3    Period  Weeks    Status  Achieved      PT SHORT TERM GOAL #3   Title  PT left knee extension to be five degrees or less to normalize gait.     Time  3     Period  Weeks    Status  Achieved      PT SHORT TERM GOAL #4   Title  PT to be walking with a cane and to have increased his walking to at least 15 minutes without stopping.     Baseline  06/27/2018 - amb with SPC    Time  3    Period  Weeks    Status  Achieved        PT Long Term Goals - 07/07/18 1451      PT LONG TERM GOAL #1   Title  PT pain in his left knee to be no greater than a 2/10 to allow pt to sleep throughout the night without waking due to pain.     Time  6    Period  Weeks    Status  On-going      PT LONG TERM GOAL #2   Title  PT left knee flexion to be to 120 degrees to allow pt to squat to pick something off of the floor .     Baseline  07/07/2018:  120    Time  6    Period  Weeks    Status  On-going      PT LONG TERM GOAL #3   Title  PT left knee extension to be less than 3 degrees to allow normalized heel toe gait.     Baseline  07/07/2018:  5 degrees     Time  6    Period  Weeks    Status  On-going      PT LONG TERM GOAL #4   Title  PT to be able to walk in comfort for up to 45 minutes for return to work duties.     Time  6    Period  Weeks    Status  On-going      PT LONG TERM GOAL #5   Title  PT mm strength in LT LE to be at least 4+/5 to be able to go up and down steps in a reciprocal manner with one hand hold x 10 steps     Time  6    Period  Weeks    Status  On-going      PT LONG TERM GOAL #6   Title  Pt to be able to single leg stance x 20 seconds on B LE to improve safety when walking on uneven terrain at work     Time  6    Period  Weeks    Status  On-going            Plan -  07/07/18 1503    Clinical Impression Statement  PT reassessed with progress noted in all areas.  PT main limitation is decreased knee extension, edema and pain.  Pt will continue to benefit from skilled physical therapy to address these deficits.      Rehab Potential  Good    PT Frequency  3x / week    PT Duration  6 weeks    PT Treatment/Interventions   ADLs/Self Care Home Management;Cryotherapy;Gait training;Stair training;Functional mobility training;Therapeutic activities;Therapeutic exercise;Balance training;Neuromuscular re-education;Patient/family education;Manual lymph drainage;Manual techniques;Passive range of motion    PT Next Visit Plan  Focus on functional strength only ie steps, sit to stand squat and pick an item off the floor, focus on ROM,(extension greater than flexion to include PROM).  Pt balance has improved greatly may do vector stance .     PT Home Exercise Plan  QS, modified SLR, heelslide, hip abduction and sitting LAQ; 06/27/2018 - supine and standing hamstring stretch, SLR - flexion    Consulted and Agree with Plan of Care  Patient       Patient will benefit from skilled therapeutic intervention in order to improve the following deficits and impairments:  Abnormal gait, Decreased activity tolerance, Decreased balance, Decreased range of motion, Decreased mobility, Decreased strength, Difficulty walking, Decreased skin integrity, Pain, Impaired flexibility  Visit Diagnosis: Acute pain of left knee  Stiffness of left knee, not elsewhere classified  Difficulty in walking, not elsewhere classified  Acute pain of right knee  Stiffness of right knee, not elsewhere classified     Problem List Patient Active Problem List   Diagnosis Date Noted  . Osteoarthritis of left knee 06/11/2018  . Osteoarthritis of right knee 09/27/2016    Rayetta Humphrey, PT CLT [phone] 07/07/2018, 3:11 PM  [place], [place], [address] Phone: [phone]   Fax:  [phone]  Name: Ricky Bonilla MRN: [id number] Date of Birth: 11-12-1950

## 2018-07-09 ENCOUNTER — Ambulatory Visit (HOSPITAL_COMMUNITY): Payer: PRIVATE HEALTH INSURANCE | Admitting: Physical Therapy

## 2018-07-09 ENCOUNTER — Encounter (HOSPITAL_COMMUNITY): Payer: Self-pay | Admitting: Physical Therapy

## 2018-07-09 DIAGNOSIS — M25662 Stiffness of left knee, not elsewhere classified: Secondary | ICD-10-CM

## 2018-07-09 DIAGNOSIS — M25562 Pain in left knee: Secondary | ICD-10-CM

## 2018-07-09 DIAGNOSIS — R262 Difficulty in walking, not elsewhere classified: Secondary | ICD-10-CM

## 2018-07-09 NOTE — Therapy (Signed)
[facility] [place], [place], [address] Phone: [phone]   Fax:  [phone]  Physical Therapy Treatment  Patient Details  Name: Ricky Bonilla MRN: [id number] Date of Birth: [birth year] Referring Provider (PT): Rod Can    Encounter Date: 07/09/2018  PT End of Session - 07/09/18 1255    Visit Number  11    Number of Visits  18    Date for PT Re-Evaluation  07/16/18   Mini-reassess 07/07/18   Authorization Type  The health plan    Authorization Time Period  Cert: 7/65-->46/50/35    Authorization - Visit Number  11   Corrected count   Authorization - Number of Visits  60    PT Start Time  1300    PT Stop Time  1340    PT Time Calculation (min)  40 min    Activity Tolerance  Patient tolerated treatment well;No increased pain    Behavior During Therapy  WFL for tasks assessed/performed       Past Medical History:  Diagnosis Date  . Hypertension   . OA (osteoarthritis)    left knee  . Wears glasses   . Wears hearing aid in both ears   . Wears partial dentures    upper    Past Surgical History:  Procedure Laterality Date  . APPENDECTOMY  1979 approx.  . COLONOSCOPY N/A 08/07/2016   Procedure: COLONOSCOPY;  Surgeon: Aviva Signs, MD;  [facility];  Service: Gastroenterology;  Laterality: N/A;  . KNEE ARTHROPLASTY Right 09/27/2016   Procedure: RIGHT TOTAL KNEE ARTHROPLASTY WITH COMPUTER NAVIGATION;  Surgeon: Rod Can, MD;  [facility];  Service: Orthopedics;  Laterality: Right;  Combined spinal/epidural+regional block  . KNEE ARTHROPLASTY Left 06/11/2018   Procedure: LEFT  TOTAL KNEE ARTHROPLASTY WITH COMPUTER NAVIGATION;  Surgeon: Rod Can, MD;  [facility];  Service: Orthopedics;  Laterality: Left;  Needs RNFA  . ROTATOR CUFF REPAIR Bilateral 2011 approx.    There were no vitals filed for this visit.  Subjective Assessment - 07/09/18 1300    Subjective  PT states that he  feels like he must be restless during the night because he is sore when he wakes up in the mornings.     Pertinent History  OA, HTN     Limitations  Sitting;Standing;Walking;House hold activities;Lifting    How long can you sit comfortably?  20 minutes was 15 minutes     How long can you stand comfortably?  25 minutes was 25 minutes     How long can you walk comfortably?  Pt walking without assistive device for about 15 mintues was Walking with a walker he has walked at the most for five minutes     Patient Stated Goals  less pain, better movement, better sleep, walk without the walker     Currently in Pain?  Yes    Pain Score  4     Pain Location  Knee    Pain Orientation  Left;Medial    Pain Descriptors / Indicators  Sore    Pain Type  Acute pain    Pain Onset  1 to 4 weeks ago    Aggravating Factors   sleeping     Pain Relieving Factors  pain meds     Effect of Pain on Daily Activities  limits  [facility] Adult PT Treatment/Exercise - 07/09/18 0001      Exercises   Exercises  Knee/Hip      Knee/Hip Exercises: Stretches   Passive Hamstring Stretch  Left;3 reps;30 seconds    Passive Hamstring Stretch Limitations  standing, 12" step    Knee: Self-Stretch to increase Flexion  Left;10 seconds    Knee: Self-Stretch Limitations  on 12'' step    Gastroc Stretch  Both;3 reps;30 seconds    Gastroc Stretch Limitations  slant board      Knee/Hip Exercises: Standing   Heel Raises  Both;15 reps    Forward Lunges  Both;10 reps    Terminal Knee Extension  Left;10 reps    Lateral Step Up  Left;10 reps;Step Height: 6"    Forward Step Up  Left;10 reps;Step Height: 6"    Rocker Board  2 minutes    SLS  SLS x 5 left      Knee/Hip Exercises: Supine   Quad Sets  Left;10 reps   5 second holds   Short [employer]  Left;15 reps   5 second holds   Terminal Knee Extension  Left;10 reps    Knee Extension  AROM    Knee Extension Limitations  5    Knee Flexion   AROM    Knee Flexion Limitations  121    Other Supine Knee/Hip Exercises  PROM       Manual Therapy   Manual Therapy  Edema management;Joint mobilization    Manual therapy comments  completed separate from all other aspects of treatement    Edema Management  retrograde massage with BLE elevated for swelling and pain    Joint Mobilization  patellar mobs Grade III-IV all directions for mobility               PT Short Term Goals - 07/07/18 1449      PT SHORT TERM GOAL #1   Title  PT pain in his left knee to be no greater than a 5/10 so that he is not waking any greater than 2 x a night     Time  3    Period  Weeks    Status  Partially Met   waking twice a night now but pain goes as high as a 6/10      PT SHORT TERM GOAL #2   Title  PT to be able to bend his left knee to 100 degrees to allow him to sit for 30 minutes in comfort to meals.    Baseline  06/27/2018 - 116 degrees    Time  3    Period  Weeks    Status  Achieved      PT SHORT TERM GOAL #3   Title  PT left knee extension to be five degrees or less to normalize gait.     Time  3    Period  Weeks    Status  Achieved      PT SHORT TERM GOAL #4   Title  PT to be walking with a cane and to have increased his walking to at least 15 minutes without stopping.     Baseline  06/27/2018 - amb with [facility]PC    Time  3    Period  Weeks    Status  Achieved        PT Long Term Goals - 07/07/18 1451      PT LONG TERM GOAL #1   Title  PT pain in his  left knee to be no greater than a 2/10 to allow pt to sleep throughout the night without waking due to pain.     Time  6    Period  Weeks    Status  On-going      PT LONG TERM GOAL #2   Title  PT left knee flexion to be to 120 degrees to allow pt to squat to pick something off of the floor .     Baseline  07/07/2018:  120    Time  6    Period  Weeks    Status  On-going      PT LONG TERM GOAL #3   Title  PT left knee extension to be less than 3 degrees to allow normalized  heel toe gait.     Baseline  07/07/2018:  5 degrees     Time  6    Period  Weeks    Status  On-going      PT LONG TERM GOAL #4   Title  PT to be able to walk in comfort for up to 45 minutes for return to work duties.     Time  6    Period  Weeks    Status  On-going      PT LONG TERM GOAL #5   Title  PT mm strength in LT LE to be at least 4+/5 to be able to go up and down steps in a reciprocal manner with one hand hold x 10 steps     Time  6    Period  Weeks    Status  On-going      PT LONG TERM GOAL #6   Title  Pt to be able to single leg stance x 20 seconds on B LE to improve safety when walking on uneven terrain at work     Time  6    Period  Weeks    Status  On-going            Plan - 07/09/18 1343    Clinical Impression Statement  PROM and Terminal extension while supine added to program to attempt to improve knee extension.  PT continues to have swelling which is most likely affecting his ability to totally extend his knee.  Therapist encouraged pt to work on knee extension at home.     Rehab Potential  Good    PT Frequency  3x / week    PT Duration  6 weeks    PT Treatment/Interventions  ADLs/Self Care Home Management;Cryotherapy;Gait training;Stair training;Functional mobility training;Therapeutic activities;Therapeutic exercise;Balance training;Neuromuscular re-education;Patient/family education;Manual lymph drainage;Manual techniques;Passive range of motion    PT Next Visit Plan  Focus on functional strength  ie steps, sit to stand squat and pick an item off the floor, focus on ROM,(extension greater than flexion to include PROM).  Pt balance has improved greatly may do vector stance .     PT Home Exercise Plan  QS, modified SLR, heelslide, hip abduction and sitting LAQ; 06/27/2018 - supine and standing hamstring stretch, SLR - flexion    Consulted and Agree with Plan of Care  Patient       Patient will benefit from skilled therapeutic intervention in order to  improve the following deficits and impairments:  Abnormal gait, Decreased activity tolerance, Decreased balance, Decreased range of motion, Decreased mobility, Decreased strength, Difficulty walking, Decreased skin integrity, Pain, Impaired flexibility  Visit Diagnosis: Acute pain of left knee  Stiffness of left knee, not elsewhere  classified  Difficulty in walking, not elsewhere classified     Problem List Patient Active Problem List   Diagnosis Date Noted  . Osteoarthritis of left knee 06/11/2018  . Osteoarthritis of right knee 09/27/2016    Rayetta Humphrey, PT CLT [phone] 07/09/2018, 1:46 PM  [facility] [address] [place], [place], [address] Phone: [phone]   Fax:  [phone]  Name: Ricky Bonilla MRN: [id number] Date of Birth: 1951/05/07

## 2018-07-11 ENCOUNTER — Encounter (HOSPITAL_COMMUNITY): Payer: Self-pay

## 2018-07-11 ENCOUNTER — Ambulatory Visit (HOSPITAL_COMMUNITY): Payer: PRIVATE HEALTH INSURANCE

## 2018-07-11 DIAGNOSIS — M25562 Pain in left knee: Secondary | ICD-10-CM

## 2018-07-11 DIAGNOSIS — R262 Difficulty in walking, not elsewhere classified: Secondary | ICD-10-CM

## 2018-07-11 DIAGNOSIS — M25662 Stiffness of left knee, not elsewhere classified: Secondary | ICD-10-CM

## 2018-07-11 NOTE — Therapy (Signed)
[facility] [place], [place], [address] Phone: [phone]   Fax:  [phone]  Physical Therapy Treatment  Patient Details  Name: Ricky Bonilla MRN: [id number] Date of Birth: 03-30-1951 Referring Provider (PT): Rod Can    Encounter Date: 07/11/2018  PT End of Session - 07/11/18 1349    Visit Number  11    Number of Visits  18    Date for PT Re-Evaluation  07/28/18    Authorization Type  The health plan    Authorization Time Period  Cert: 4/65-->68/12/75    Authorization - Visit Number  11    Authorization - Number of Visits  63    PT Start Time  1700    PT Stop Time  1400   Therapist got sick and tx ended early.   PT Time Calculation (min)  13 min    Activity Tolerance  Patient tolerated treatment well;No increased pain    Behavior During Therapy  WFL for tasks assessed/performed       Past Medical History:  Diagnosis Date  . Hypertension   . OA (osteoarthritis)    left knee  . Wears glasses   . Wears hearing aid in both ears   . Wears partial dentures    upper    Past Surgical History:  Procedure Laterality Date  . APPENDECTOMY  1979 approx.  . COLONOSCOPY N/A 08/07/2016   Procedure: COLONOSCOPY;  Surgeon: Aviva Signs, MD;  [facility];  Service: Gastroenterology;  Laterality: N/A;  . KNEE ARTHROPLASTY Right 09/27/2016   Procedure: RIGHT TOTAL KNEE ARTHROPLASTY WITH COMPUTER NAVIGATION;  Surgeon: Rod Can, MD;  [facility];  Service: Orthopedics;  Laterality: Right;  Combined spinal/epidural+regional block  . KNEE ARTHROPLASTY Left 06/11/2018   Procedure: LEFT  TOTAL KNEE ARTHROPLASTY WITH COMPUTER NAVIGATION;  Surgeon: Rod Can, MD;  [facility];  Service: Orthopedics;  Laterality: Left;  Needs RNFA  . ROTATOR CUFF REPAIR Bilateral 2011 approx.    There were no vitals filed for this visit.  Subjective Assessment - 07/11/18 1349    Subjective  Pt stated he is  feeling good today, no reports of pain currently did take pain medication prior session.    Patient Stated Goals  less pain, better movement, better sleep, walk without the walker     Currently in Pain?  No/denies                       [facility] Adult PT Treatment/Exercise - 07/11/18 0001      Exercises   Exercises  Knee/Hip      Knee/Hip Exercises: Stretches   Passive Hamstring Stretch  Left;3 reps;30 seconds    Passive Hamstring Stretch Limitations  standing, 12" step      Knee/Hip Exercises: Standing   Heel Raises  Both;15 reps    Functional Squat  10 reps               PT Short Term Goals - 07/07/18 1449      PT SHORT TERM GOAL #1   Title  PT pain in his left knee to be no greater than a 5/10 so that he is not waking any greater than 2 x a night     Time  3    Period  Weeks    Status  Partially Met   waking twice a night now but pain goes as high as a 6/10  PT SHORT TERM GOAL #2   Title  PT to be able to bend his left knee to 100 degrees to allow him to sit for 30 minutes in comfort to meals.    Baseline  06/27/2018 - 116 degrees    Time  3    Period  Weeks    Status  Achieved      PT SHORT TERM GOAL #3   Title  PT left knee extension to be five degrees or less to normalize gait.     Time  3    Period  Weeks    Status  Achieved      PT SHORT TERM GOAL #4   Title  PT to be walking with a cane and to have increased his walking to at least 15 minutes without stopping.     Baseline  06/27/2018 - amb with SPC    Time  3    Period  Weeks    Status  Achieved        PT Long Term Goals - 07/07/18 1451      PT LONG TERM GOAL #1   Title  PT pain in his left knee to be no greater than a 2/10 to allow pt to sleep throughout the night without waking due to pain.     Time  6    Period  Weeks    Status  On-going      PT LONG TERM GOAL #2   Title  PT left knee flexion to be to 120 degrees to allow pt to squat to pick something off of the floor .      Baseline  07/07/2018:  120    Time  6    Period  Weeks    Status  On-going      PT LONG TERM GOAL #3   Title  PT left knee extension to be less than 3 degrees to allow normalized heel toe gait.     Baseline  07/07/2018:  5 degrees     Time  6    Period  Weeks    Status  On-going      PT LONG TERM GOAL #4   Title  PT to be able to walk in comfort for up to 45 minutes for return to work duties.     Time  6    Period  Weeks    Status  On-going      PT LONG TERM GOAL #5   Title  PT mm strength in LT LE to be at least 4+/5 to be able to go up and down steps in a reciprocal manner with one hand hold x 10 steps     Time  6    Period  Weeks    Status  On-going      PT LONG TERM GOAL #6   Title  Pt to be able to single leg stance x 20 seconds on B LE to improve safety when walking on uneven terrain at work     Time  6    Period  Weeks    Status  On-going            Plan - 07/11/18 1400    Clinical Impression Statement  Therapist got sick during tx and ended session early.  No charge for session.  Continue session focus wiht knee mobility for extension and functional strnegthnein    Rehab Potential  Good    PT Frequency  3x /  week    PT Duration  6 weeks    PT Treatment/Interventions  ADLs/Self Care Home Management;Cryotherapy;Gait training;Stair training;Functional mobility training;Therapeutic activities;Therapeutic exercise;Balance training;Neuromuscular re-education;Patient/family education;Manual lymph drainage;Manual techniques;Passive range of motion    PT Next Visit Plan  Focus on functional strength  ie steps, sit to stand squat and pick an item off the floor, focus on ROM,(extension greater than flexion to include PROM).  Pt balance has improved greatly may do vector stance .     PT Home Exercise Plan  QS, modified SLR, heelslide, hip abduction and sitting LAQ; 06/27/2018 - supine and standing hamstring stretch, SLR - flexion       Patient will benefit from skilled  therapeutic intervention in order to improve the following deficits and impairments:  Abnormal gait, Decreased activity tolerance, Decreased balance, Decreased range of motion, Decreased mobility, Decreased strength, Difficulty walking, Decreased skin integrity, Pain, Impaired flexibility  Visit Diagnosis: Acute pain of left knee  Stiffness of left knee, not elsewhere classified  Difficulty in walking, not elsewhere classified     Problem List Patient Active Problem List   Diagnosis Date Noted  . Osteoarthritis of left knee 06/11/2018  . Osteoarthritis of right knee 09/27/2016   Ihor Austin, LPTA; [place]  Aldona Lento 07/11/2018, 2:04 PM  [facility] [address] [place], [place], [address] Phone: [phone]   Fax:  [phone]  Name: ZANIEL MARINEAU MRN: [id number] Date of Birth: Dec 17, 1950

## 2018-07-14 ENCOUNTER — Ambulatory Visit (HOSPITAL_COMMUNITY): Payer: PRIVATE HEALTH INSURANCE | Admitting: Physical Therapy

## 2018-07-14 DIAGNOSIS — M25561 Pain in right knee: Secondary | ICD-10-CM

## 2018-07-14 DIAGNOSIS — M25662 Stiffness of left knee, not elsewhere classified: Secondary | ICD-10-CM

## 2018-07-14 DIAGNOSIS — M25562 Pain in left knee: Secondary | ICD-10-CM

## 2018-07-14 DIAGNOSIS — R262 Difficulty in walking, not elsewhere classified: Secondary | ICD-10-CM

## 2018-07-14 DIAGNOSIS — M25661 Stiffness of right knee, not elsewhere classified: Secondary | ICD-10-CM

## 2018-07-14 NOTE — Therapy (Signed)
[place], [place], [address] Phone: [phone]   Fax:  [phone]  Physical Therapy Treatment  Patient Details  Name: Ricky Bonilla MRN: [id number] Date of Birth: 1950-12-20 Referring Provider (PT): Rod Can    Encounter Date: 07/14/2018  PT End of Session - 07/14/18 1511    Visit Number  12    Number of Visits  18    Date for PT Re-Evaluation  07/28/18    Authorization Type  The health plan    Authorization Time Period  Cert: 6/56-->81/27/51    Authorization - Visit Number  12    Authorization - Number of Visits  60    PT Start Time  7001    PT Stop Time  1515    PT Time Calculation (min)  40 min    Activity Tolerance  Patient tolerated treatment well;No increased pain    Behavior During Therapy  WFL for tasks assessed/performed       Past Medical History:  Diagnosis Date  . Hypertension   . OA (osteoarthritis)    left knee  . Wears glasses   . Wears hearing aid in both ears   . Wears partial dentures    upper    Past Surgical History:  Procedure Laterality Date  . APPENDECTOMY  1979 approx.  . COLONOSCOPY N/A 08/07/2016   Procedure: COLONOSCOPY;  Surgeon: Aviva Signs, MD;  [facility];  Service: Gastroenterology;  Laterality: N/A;  . KNEE ARTHROPLASTY Right 09/27/2016   Procedure: RIGHT TOTAL KNEE ARTHROPLASTY WITH COMPUTER NAVIGATION;  Surgeon: Rod Can, MD;  Location: [facility];  Service: Orthopedics;  Laterality: Right;  Combined spinal/epidural+regional block  . KNEE ARTHROPLASTY Left 06/11/2018   Procedure: LEFT  TOTAL KNEE ARTHROPLASTY WITH COMPUTER NAVIGATION;  Surgeon: Rod Can, MD;  Location: [facility];  Service: Orthopedics;  Laterality: Left;  Needs RNFA  . ROTATOR CUFF REPAIR Bilateral 2011 approx.    There were no vitals filed for this visit.  Subjective Assessment - 07/14/18 1437    Subjective  PT has been working on steps at his home.      Pertinent  History  OA, HTN     Limitations  Sitting;Standing;Walking;House hold activities;Lifting    How long can you sit comfortably?  20 minutes was 15 minutes     How long can you stand comfortably?  25 minutes was 25 minutes     How long can you walk comfortably?  Pt walking without assistive device for about 15 mintues was Walking with a walker he has walked at the most for five minutes     Patient Stated Goals  less pain, better movement, better sleep, walk without the walker     Currently in Pain?  No/denies    Pain Onset  1 to 4 weeks ago                       [facility] Adult PT Treatment/Exercise - 07/14/18 0001      Exercises   Exercises  Knee/Hip      Knee/Hip Exercises: Stretches   Active Hamstring Stretch  Left;3 reps;30 seconds    Active Hamstring Stretch Limitations  supine    Passive Hamstring Stretch  Left;3 reps;30 seconds    Passive Hamstring Stretch Limitations  standing, 12" step    Gastroc Stretch  Both;3 reps;30 seconds    Gastroc Stretch Limitations  slant board      Knee/Hip  Exercises: Standing   Heel Raises  Both;15 reps    Heel Raises Limitations  with toe raises    Terminal Knee Extension  Left;10 reps    Functional Squat  15 reps    Rocker Board  2 minutes    SLS with Vectors  10" vector B x3      Knee/Hip Exercises: Supine   Quad Sets  Left;10 reps   5 second holds   Short Arc Quad Sets  Left;15 reps   5 second holds   Terminal Knee Extension  Left;10 reps    Knee Extension  AROM    Knee Extension Limitations  3    Knee Flexion  AROM    Knee Flexion Limitations  124      Manual Therapy   Manual Therapy  Edema management;Joint mobilization    Manual therapy comments  completed separate from all other aspects of treatement    Edema Management  retrograde massage with BLE elevated for swelling and pain    Joint Mobilization  patellar mobs Grade III-IV all directions for mobility               PT Short Term Goals - 07/14/18 1512       PT SHORT TERM GOAL #1   Title  PT pain in his left knee to be no greater than a 5/10 so that he is not waking any greater than 2 x a night     Time  3    Period  Weeks    Status  Achieved   waking twice a night now but pain goes as high as a 6/10      PT SHORT TERM GOAL #2   Title  PT to be able to bend his left knee to 100 degrees to allow him to sit for 30 minutes in comfort to meals.    Baseline  06/27/2018 - 116 degrees    Time  3    Period  Weeks    Status  Achieved      PT SHORT TERM GOAL #3   Title  PT left knee extension to be five degrees or less to normalize gait.     Time  3    Period  Weeks    Status  Achieved      PT SHORT TERM GOAL #4   Title  PT to be walking with a cane and to have increased his walking to at least 15 minutes without stopping.     Baseline  06/27/2018 - amb with SPC    Time  3    Period  Weeks    Status  Achieved        PT Long Term Goals - 07/14/18 1513      PT LONG TERM GOAL #1   Title  PT pain in his left knee to be no greater than a 2/10 to allow pt to sleep throughout the night without waking due to pain.     Time  6    Period  Weeks    Status  On-going      PT LONG TERM GOAL #2   Title  PT left knee flexion to be to 120 degrees to allow pt to squat to pick something off of the floor .     Baseline  07/07/2018:  120    Time  6    Period  Weeks    Status  Achieved      PT LONG TERM  GOAL #3   Title  PT left knee extension to be less than 3 degrees to allow normalized heel toe gait.     Baseline  07/07/2018:  5 degrees     Time  6    Period  Weeks    Status  On-going      PT LONG TERM GOAL #4   Title  PT to be able to walk in comfort for up to 45 minutes for return to work duties.     Time  6    Period  Weeks    Status  On-going      PT LONG TERM GOAL #5   Title  PT mm strength in LT LE to be at least 4+/5 to be able to go up and down steps in a reciprocal manner with one hand hold x 10 steps     Time  6    Period   Weeks    Status  Achieved      PT LONG TERM GOAL #6   Title  Pt to be able to single leg stance x 20 seconds on B LE to improve safety when walking on uneven terrain at work     Time  6    Period  Weeks    Status  Partially Met            Plan - 07/14/18 1514    Clinical Impression Statement  Pt ROM is currently 3 to 125.  PT desires to work on strength to return to work so focus will switch from ROM to strengthening.     Rehab Potential  Good    PT Frequency  3x / week    PT Duration  6 weeks    PT Treatment/Interventions  ADLs/Self Care Home Management;Cryotherapy;Gait training;Stair training;Functional mobility training;Therapeutic activities;Therapeutic exercise;Balance training;Neuromuscular re-education;Patient/family education;Manual lymph drainage;Manual techniques;Passive range of motion    PT Next Visit Plan  Focus on functional strength  ie steps, sit to stand squat and pick an item off the floor,     PT Home Exercise Plan  QS, modified SLR, heelslide, hip abduction and sitting LAQ; 06/27/2018 - supine and standing hamstring stretch, SLR - flexion       Patient will benefit from skilled therapeutic intervention in order to improve the following deficits and impairments:  Abnormal gait, Decreased activity tolerance, Decreased balance, Decreased range of motion, Decreased mobility, Decreased strength, Difficulty walking, Decreased skin integrity, Pain, Impaired flexibility  Visit Diagnosis: Acute pain of left knee  Stiffness of left knee, not elsewhere classified  Difficulty in walking, not elsewhere classified  Acute pain of right knee  Stiffness of right knee, not elsewhere classified     Problem List Patient Active Problem List   Diagnosis Date Noted  . Osteoarthritis of left knee 06/11/2018  . Osteoarthritis of right knee 09/27/2016  Rayetta Humphrey, PT CLT [phone] 07/14/2018, 3:17 PM  [place] [address] [place], [place], [address] Phone: [phone]   Fax:  [phone]  Name: Ricky Bonilla MRN: [id number] Date of Birth: 10-07-1950

## 2018-07-16 ENCOUNTER — Other Ambulatory Visit: Payer: Self-pay

## 2018-07-16 ENCOUNTER — Ambulatory Visit (HOSPITAL_COMMUNITY): Payer: PRIVATE HEALTH INSURANCE | Admitting: Physical Therapy

## 2018-07-16 DIAGNOSIS — M25562 Pain in left knee: Secondary | ICD-10-CM

## 2018-07-16 DIAGNOSIS — R262 Difficulty in walking, not elsewhere classified: Secondary | ICD-10-CM

## 2018-07-16 DIAGNOSIS — M25662 Stiffness of left knee, not elsewhere classified: Secondary | ICD-10-CM

## 2018-07-16 NOTE — Therapy (Signed)
[place], [place], [address] Phone: [phone]   Fax:  [phone]  Physical Therapy Treatment  Patient Details  Name: Ricky Bonilla MRN: [id number] Date of Birth: 02-07-1951 Referring Provider (PT): Rod Can    Encounter Date: 07/16/2018  PT End of Session - 07/16/18 1421    Visit Number  13    Number of Visits  18    Date for PT Re-Evaluation  07/28/18    Authorization Type  The health plan    Authorization Time Period  Cert: 7/74-->12/87/86    Authorization - Visit Number  13    Authorization - Number of Visits  60    PT Start Time  7672    PT Stop Time  1425    PT Time Calculation (min)  40 min    Activity Tolerance  Patient tolerated treatment well;No increased pain    Behavior During Therapy  WFL for tasks assessed/performed       Past Medical History:  Diagnosis Date  . Hypertension   . OA (osteoarthritis)    left knee  . Wears glasses   . Wears hearing aid in both ears   . Wears partial dentures    upper    Past Surgical History:  Procedure Laterality Date  . APPENDECTOMY  1979 approx.  . COLONOSCOPY N/A 08/07/2016   Procedure: COLONOSCOPY;  Surgeon: Aviva Signs, MD;  [facility];  Service: Gastroenterology;  Laterality: N/A;  . KNEE ARTHROPLASTY Right 09/27/2016   Procedure: RIGHT TOTAL KNEE ARTHROPLASTY WITH COMPUTER NAVIGATION;  Surgeon: Rod Can, MD;  Location: [facility];  Service: Orthopedics;  Laterality: Right;  Combined spinal/epidural+regional block  . KNEE ARTHROPLASTY Left 06/11/2018   Procedure: LEFT  TOTAL KNEE ARTHROPLASTY WITH COMPUTER NAVIGATION;  Surgeon: Rod Can, MD;  Location: [facility];  Service: Orthopedics;  Laterality: Left;  Needs RNFA  . ROTATOR CUFF REPAIR Bilateral 2011 approx.    There were no vitals filed for this visit.  Subjective Assessment - 07/16/18 1342    Subjective  PT states that he really wants to work on strengthening  more than ROM     Pertinent History  OA, HTN     Limitations  Sitting;Standing;Walking;House hold activities;Lifting    How long can you sit comfortably?  20 minutes was 15 minutes     How long can you stand comfortably?  25 minutes was 25 minutes     How long can you walk comfortably?  Pt walking without assistive device for about 15 mintues was Walking with a walker he has walked at the most for five minutes     Patient Stated Goals  less pain, better movement, better sleep, walk without the walker     Pain Score  3     Pain Location  Knee    Pain Orientation  Left    Pain Descriptors / Indicators  Aching    Pain Type  Chronic pain    Pain Onset  1 to 4 weeks ago    Aggravating Factors   weight bearing     Pain Relieving Factors  ice, elevation     Effect of Pain on Daily Activities  limits                [facility] Adult PT Treatment/Exercise - 07/16/18 0001      Exercises   Exercises  Knee/Hip      Knee/Hip Exercises: Stretches   Passive Hamstring Stretch  Left;3 reps;30 seconds    Passive Hamstring Stretch Limitations  standing, 12" step    Gastroc Stretch  Both;3 reps;30 seconds    Gastroc Stretch Limitations  slant board      Knee/Hip Exercises: Aerobic   Nustep  level 3 x 5'      Knee/Hip Exercises: Machines for Strengthening   Other Machine  bodycraft leg press 5 PL  x 15       Knee/Hip Exercises: Standing   Heel Raises  Both;15 reps   with 2 # wt    Heel Raises Limitations  with squats     Terminal Knee Extension  Left;10 reps    Terminal Knee Extension Limitations  green weighted ball    Stairs  3 RT     SLS with Vectors  10" vector B x3      Knee/Hip Exercises: Seated   Long Arc Quad  Strengthening;Left;15 reps;Weights    Long Arc Quad Weight  4 lbs.    Sit to [employer]  10 reps               PT Short Term Goals - 07/14/18 1512      PT SHORT TERM GOAL #1   Title  PT pain in his left knee to be no greater than a 5/10 so that he is not waking any  greater than 2 x a night     Time  3    Period  Weeks    Status  Achieved   waking twice a night now but pain goes as high as a 6/10      PT SHORT TERM GOAL #2   Title  PT to be able to bend his left knee to 100 degrees to allow him to sit for 30 minutes in comfort to meals.    Baseline  06/27/2018 - 116 degrees    Time  3    Period  Weeks    Status  Achieved      PT SHORT TERM GOAL #3   Title  PT left knee extension to be five degrees or less to normalize gait.     Time  3    Period  Weeks    Status  Achieved      PT SHORT TERM GOAL #4   Title  PT to be walking with a cane and to have increased his walking to at least 15 minutes without stopping.     Baseline  06/27/2018 - amb with SPC    Time  3    Period  Weeks    Status  Achieved        PT Long Term Goals - 07/14/18 1513      PT LONG TERM GOAL #1   Title  PT pain in his left knee to be no greater than a 2/10 to allow pt to sleep throughout the night without waking due to pain.     Time  6    Period  Weeks    Status  On-going      PT LONG TERM GOAL #2   Title  PT left knee flexion to be to 120 degrees to allow pt to squat to pick something off of the floor .     Baseline  07/07/2018:  120    Time  6    Period  Weeks    Status  Achieved      PT LONG TERM GOAL #3   Title  PT left knee  extension to be less than 3 degrees to allow normalized heel toe gait.     Baseline  07/07/2018:  5 degrees     Time  6    Period  Weeks    Status  On-going      PT LONG TERM GOAL #4   Title  PT to be able to walk in comfort for up to 45 minutes for return to work duties.     Time  6    Period  Weeks    Status  On-going      PT LONG TERM GOAL #5   Title  PT mm strength in LT LE to be at least 4+/5 to be able to go up and down steps in a reciprocal manner with one hand hold x 10 steps     Time  6    Period  Weeks    Status  Achieved      PT LONG TERM GOAL #6   Title  Pt to be able to single leg stance x 20 seconds on B LE to  improve safety when walking on uneven terrain at work     Time  6    Period  Weeks    Status  Partially Met            Plan - 07/16/18 1421    Clinical Impression Statement  Advanced treatment towards more functional strengtheniing to return pt to prior functional level.  Pt fatiqued at end of session.     Rehab Potential  Good    PT Frequency  3x / week    PT Duration  6 weeks    PT Treatment/Interventions  ADLs/Self Care Home Management;Cryotherapy;Gait training;Stair training;Functional mobility training;Therapeutic activities;Therapeutic exercise;Balance training;Neuromuscular re-education;Patient/family education;Manual lymph drainage;Manual techniques;Passive range of motion    PT Next Visit Plan   pick an item off the floor,     PT Home Exercise Plan  QS, modified SLR, heelslide, hip abduction and sitting LAQ; 06/27/2018 - supine and standing hamstring stretch, SLR - flexion       Patient will benefit from skilled therapeutic intervention in order to improve the following deficits and impairments:  Abnormal gait, Decreased activity tolerance, Decreased balance, Decreased range of motion, Decreased mobility, Decreased strength, Difficulty walking, Decreased skin integrity, Pain, Impaired flexibility  Visit Diagnosis: Acute pain of left knee  Stiffness of left knee, not elsewhere classified  Difficulty in walking, not elsewhere classified     Problem List Patient Active Problem List   Diagnosis Date Noted  . Osteoarthritis of left knee 06/11/2018  . Osteoarthritis of right knee 09/27/2016    Rayetta Humphrey, PT CLT [phone] 07/16/2018, 2:25 PM  [facility] [address] [place], [place], [address] Phone: [phone]   Fax:  [phone]  Name: Ricky Bonilla MRN: [id number] Date of Birth: [birth year]

## 2018-07-18 ENCOUNTER — Encounter (HOSPITAL_COMMUNITY): Payer: Self-pay

## 2018-07-18 ENCOUNTER — Ambulatory Visit (HOSPITAL_COMMUNITY): Payer: PRIVATE HEALTH INSURANCE

## 2018-07-18 DIAGNOSIS — M25562 Pain in left knee: Secondary | ICD-10-CM

## 2018-07-18 DIAGNOSIS — R262 Difficulty in walking, not elsewhere classified: Secondary | ICD-10-CM

## 2018-07-18 DIAGNOSIS — M25662 Stiffness of left knee, not elsewhere classified: Secondary | ICD-10-CM

## 2018-07-18 NOTE — Therapy (Signed)
[place], [place], [address] Phone: [phone]   Fax:  [phone]  Physical Therapy Treatment  Patient Details  Name: Ricky Bonilla MRN: [id number] Date of Birth: Mar 20, 1951 Referring Provider (PT): Rod Can    Encounter Date: 07/18/2018  PT End of Session - 07/18/18 1358    Visit Number  14    Number of Visits  18    Date for PT Re-Evaluation  07/28/18    Authorization Type  The health plan    Authorization Time Period  Cert: 2/53-->66/44/03    Authorization - Visit Number  14    Authorization - Number of Visits  60    PT Start Time  4742    PT Stop Time  1426    PT Time Calculation (min)  39 min    Activity Tolerance  Patient tolerated treatment well;No increased pain    Behavior During Therapy  WFL for tasks assessed/performed       Past Medical History:  Diagnosis Date  . Hypertension   . OA (osteoarthritis)    left knee  . Wears glasses   . Wears hearing aid in both ears   . Wears partial dentures    upper    Past Surgical History:  Procedure Laterality Date  . APPENDECTOMY  1979 approx.  . COLONOSCOPY N/A 08/07/2016   Procedure: COLONOSCOPY;  Surgeon: Aviva Signs, MD;  [facility];  Service: Gastroenterology;  Laterality: N/A;  . KNEE ARTHROPLASTY Right 09/27/2016   Procedure: RIGHT TOTAL KNEE ARTHROPLASTY WITH COMPUTER NAVIGATION;  Surgeon: Rod Can, MD;  [facility];  Service: Orthopedics;  Laterality: Right;  Combined spinal/epidural+regional block  . KNEE ARTHROPLASTY Left 06/11/2018   Procedure: LEFT  TOTAL KNEE ARTHROPLASTY WITH COMPUTER NAVIGATION;  Surgeon: Rod Can, MD;  [facility];  Service: Orthopedics;  Laterality: Left;  Needs RNFA  . ROTATOR CUFF REPAIR Bilateral 2011 approx.    There were no vitals filed for this visit.  Subjective Assessment - 07/18/18 1357    Subjective  Pt stated he's feeling good no reports of pain currently.       Patient Stated Goals  less pain, better movement, better sleep, walk without the walker     Currently in Pain?  No/denies                       [facility] Adult PT Treatment/Exercise - 07/18/18 0001      Exercises   Exercises  Knee/Hip      Knee/Hip Exercises: Stretches   Passive Hamstring Stretch  Left;3 reps;30 seconds    Passive Hamstring Stretch Limitations  standing, 12" step    Gastroc Stretch  Both;3 reps;30 seconds    Gastroc Stretch Limitations  slant board      Knee/Hip Exercises: Machines for Strengthening   Other Machine  bodycraft leg press 5 PL  2x 10       Knee/Hip Exercises: Standing   Heel Raises  Both;2 sets;10 reps    Heel Raises Limitations  with squats     Terminal Knee Extension  Left;15 reps    Theraband Level (Terminal Knee Extension)  Level 3 (Green)    Functional Squat  2 sets;10 reps    Functional Squat Limitations  proper lifting yellow ball from 12in step, cueing for mechanics    Wall Squat  10 reps;3 seconds    Stairs  5RT    SLS with Vectors  10" vector B x5    Other Standing Knee Exercises  tandem stance on foam      Knee/Hip Exercises: Seated   Long Arc Quad  Strengthening;Left;15 reps;Weights    Long Arc Quad Weight  4 lbs.    Long [employer] Limitations  3 second holds    Sit to [employer]  10 reps;without UE support               PT Short Term Goals - 07/14/18 1512      PT SHORT TERM GOAL #1   Title  PT pain in his left knee to be no greater than a 5/10 so that he is not waking any greater than 2 x a night     Time  3    Period  Weeks    Status  Achieved   waking twice a night now but pain goes as high as a 6/10      PT SHORT TERM GOAL #2   Title  PT to be able to bend his left knee to 100 degrees to allow him to sit for 30 minutes in comfort to meals.    Baseline  06/27/2018 - 116 degrees    Time  3    Period  Weeks    Status  Achieved      PT SHORT TERM GOAL #3   Title  PT left knee extension to be five  degrees or less to normalize gait.     Time  3    Period  Weeks    Status  Achieved      PT SHORT TERM GOAL #4   Title  PT to be walking with a cane and to have increased his walking to at least 15 minutes without stopping.     Baseline  06/27/2018 - amb with SPC    Time  3    Period  Weeks    Status  Achieved        PT Long Term Goals - 07/14/18 1513      PT LONG TERM GOAL #1   Title  PT pain in his left knee to be no greater than a 2/10 to allow pt to sleep throughout the night without waking due to pain.     Time  6    Period  Weeks    Status  On-going      PT LONG TERM GOAL #2   Title  PT left knee flexion to be to 120 degrees to allow pt to squat to pick something off of the floor .     Baseline  07/07/2018:  120    Time  6    Period  Weeks    Status  Achieved      PT LONG TERM GOAL #3   Title  PT left knee extension to be less than 3 degrees to allow normalized heel toe gait.     Baseline  07/07/2018:  5 degrees     Time  6    Period  Weeks    Status  On-going      PT LONG TERM GOAL #4   Title  PT to be able to walk in comfort for up to 45 minutes for return to work duties.     Time  6    Period  Weeks    Status  On-going      PT LONG TERM GOAL #5   Title  PT mm strength in LT LE  to be at least 4+/5 to be able to go up and down steps in a reciprocal manner with one hand hold x 10 steps     Time  6    Period  Weeks    Status  Achieved      PT LONG TERM GOAL #6   Title  Pt to be able to single leg stance x 20 seconds on B LE to improve safety when walking on uneven terrain at work     Time  6    Period  Weeks    Status  Partially Met            Plan - 07/18/18 1407    Clinical Impression Statement  Continued session focus wiht functional strengthening.  Added proper lifting with multimodal cueing to improve mechanics, pt unable to demonstrate appropriate mechanics from floor, improved lifting from 12in step height.  Also added wall squats for  strengthening and tandem stance on foam for balance training.  No reports of pain, was limited for visible musculature fatigue.      Rehab Potential  Good    PT Frequency  3x / week    PT Duration  6 weeks    PT Treatment/Interventions  ADLs/Self Care Home Management;Cryotherapy;Gait training;Stair training;Functional mobility training;Therapeutic activities;Therapeutic exercise;Balance training;Neuromuscular re-education;Patient/family education;Manual lymph drainage;Manual techniques;Passive range of motion    PT Next Visit Plan  continue functional strengthening, progressing proper lifting from the floor.      PT Home Exercise Plan  QS, modified SLR, heelslide, hip abduction and sitting LAQ; 06/27/2018 - supine and standing hamstring stretch, SLR - flexion       Patient will benefit from skilled therapeutic intervention in order to improve the following deficits and impairments:  Abnormal gait, Decreased activity tolerance, Decreased balance, Decreased range of motion, Decreased mobility, Decreased strength, Difficulty walking, Decreased skin integrity, Pain, Impaired flexibility  Visit Diagnosis: Acute pain of left knee  Stiffness of left knee, not elsewhere classified  Difficulty in walking, not elsewhere classified     Problem List Patient Active Problem List   Diagnosis Date Noted  . Osteoarthritis of left knee 06/11/2018  . Osteoarthritis of right knee 09/27/2016   Ihor Austin, LPTA; [place]  Aldona Lento 07/18/2018, 2:27 PM  [facility] [address] [place], [place], [address] Phone: [phone]   Fax:  [phone]  Name: Ricky Bonilla MRN: [id number] Date of Birth: 1951/04/05

## 2018-07-21 ENCOUNTER — Ambulatory Visit (HOSPITAL_COMMUNITY): Payer: PRIVATE HEALTH INSURANCE | Admitting: Physical Therapy

## 2018-07-21 ENCOUNTER — Encounter (HOSPITAL_COMMUNITY): Payer: Self-pay | Admitting: Physical Therapy

## 2018-07-21 DIAGNOSIS — M25562 Pain in left knee: Secondary | ICD-10-CM | POA: Diagnosis not present

## 2018-07-21 DIAGNOSIS — M25662 Stiffness of left knee, not elsewhere classified: Secondary | ICD-10-CM

## 2018-07-21 DIAGNOSIS — R262 Difficulty in walking, not elsewhere classified: Secondary | ICD-10-CM

## 2018-07-21 NOTE — Therapy (Signed)
[facility] [address] [place], [place], [address] Phone: [phone]   Fax:  [phone]  Physical Therapy Treatment/Discharge   Patient Details  Name: Ricky Bonilla MRN: [id number] Date of Birth: Jan 21, 1951 Referring Provider (PT): Rod Can   PHYSICAL THERAPY DISCHARGE SUMMARY  Visits from Start of Care: 15  Current functional level related to goals / functional outcomes: See below   Remaining deficits: See below    Education / Equipment: HEP Plan: Patient agrees to discharge.  Patient goals were met. Patient is being discharged due to being pleased with the current functional level.  ?????     Encounter Date: 07/21/2018  PT End of Session - 07/21/18 1439    Visit Number  15    Number of Visits  18    Date for PT Re-Evaluation  07/28/18    Authorization Type  The health plan    Authorization Time Period  Cert: 1/54-->00/86/76    Authorization - Visit Number  15    Authorization - Number of Visits  60    Activity Tolerance  Patient tolerated treatment well;No increased pain    Behavior During Therapy  WFL for tasks assessed/performed       Past Medical History:  Diagnosis Date  . Hypertension   . OA (osteoarthritis)    left knee  . Wears glasses   . Wears hearing aid in both ears   . Wears partial dentures    upper    Past Surgical History:  Procedure Laterality Date  . APPENDECTOMY  1979 approx.  . COLONOSCOPY N/A 08/07/2016   Procedure: COLONOSCOPY;  Surgeon: Aviva Signs, MD;  [facility];  Service: Gastroenterology;  Laterality: N/A;  . KNEE ARTHROPLASTY Right 09/27/2016   Procedure: RIGHT TOTAL KNEE ARTHROPLASTY WITH COMPUTER NAVIGATION;  Surgeon: Rod Can, MD;  Location: [facility];  Service: Orthopedics;  Laterality: Right;  Combined spinal/epidural+regional block  . KNEE ARTHROPLASTY Left 06/11/2018   Procedure: LEFT  TOTAL KNEE ARTHROPLASTY WITH COMPUTER NAVIGATION;  Surgeon:  Rod Can, MD;  Location: [facility];  Service: Orthopedics;  Laterality: Left;  Needs RNFA  . ROTATOR CUFF REPAIR Bilateral 2011 approx.    There were no vitals filed for this visit.  Subjective Assessment - 07/21/18 1435    Subjective  PT states that he is going to the MD tomorrow he is up to 30 minutes walking now.      Pertinent History  OA, HTN     Limitations  Sitting;Standing;Walking;House hold activities;Lifting    How long can you sit comfortably?  45 minutes was 15 minutes     How long can you stand comfortably?  30 minutes was 25 minutes     How long can you walk comfortably?  Pt walking without assistive device for about 30 mintues was Walking with a walker he has walked at the most for five minutes     Patient Stated Goals  less pain, better movement, better sleep, walk without the walker     Currently in Pain?  No/denies    Pain Onset  1 to 4 weeks ago         [facility] PT Assessment - 07/21/18 0001      Assessment   Medical Diagnosis  Lt TKR    Referring Provider (PT)  Aaron Edelman Swinteck     Onset Date/Surgical Date  06/11/18    Prior Therapy  acute      Precautions   Precautions  None  Restrictions   Weight Bearing Restrictions  No      Home Film/video editor residence      Prior Function   Level of Independence  Independent    Vocation  Full time employment    Vocation Requirements  On feet all day : [place]  no time       Cognition   Overall Cognitive Status  Within Functional Limits for tasks assessed      Observation/Other Assessments   Focus on Therapeutic Outcomes (FOTO)   64   was 32      Observation/Other Assessments-Edema    Edema  Circumferential      Circumferential Edema   Circumferential - Right  40 cm     Circumferential - Left   mid patella 45 cm was 45       Functional Tests   Functional tests  Single leg stance;Sit to Stand      Single Leg Stance   Comments  RT: 60 was  3 seconds Lt 50  was 0      Sit to Stand   Comments  5 x 10.6; initally was unable       AROM   Left Knee Extension  5   was 9   Left Knee Flexion  122   was 75     Strength   Right Hip Extension  5/5    Right Hip ABduction  5/5    Left Hip Flexion  5/5   was 2/5    Left Hip Extension  4/5    Left Hip ABduction  5/5   was 4+/5    Right Knee Flexion  5/5    Right Knee Extension  5/5    Left Knee Flexion  5/5    Left Knee Extension  5/5   was 3-   Right Ankle Dorsiflexion  5/5    Left Ankle Dorsiflexion  5/5             PT Education - 07/21/18 1459    Education Details  PT still lacking five degrees extension and the importance of trying to regain this motion.  PT still has signficant swelling which maybe affecting his ability to extend his knee.  Pt will continue to benefit from icing.  The benefits of joining a gym.     Person(s) Educated  Patient    Methods  Explanation    Comprehension  Verbalized understanding       PT Short Term Goals - 07/21/18 1451      PT SHORT TERM GOAL #1   Title  PT pain in his left knee to be no greater than a 5/10 so that he is not waking any greater than 2 x a night     Time  3    Period  Weeks    Status  Achieved   waking twice a night now but pain goes as high as a 6/10      PT SHORT TERM GOAL #2   Title  PT to be able to bend his left knee to 100 degrees to allow him to sit for 30 minutes in comfort to meals.    Baseline  06/27/2018 - 116 degrees    Time  3    Period  Weeks    Status  Achieved      PT SHORT TERM GOAL #3   Title  PT left knee extension to be  five degrees or less to normalize gait.     Time  3    Period  Weeks    Status  Achieved      PT SHORT TERM GOAL #4   Title  PT to be walking with a cane and to have increased his walking to at least 15 minutes without stopping.     Baseline  06/27/2018 - amb with SPC    Time  3    Period  Weeks    Status  Achieved        PT Long Term Goals - 07/21/18 1451      PT LONG  TERM GOAL #1   Title  PT pain in his left knee to be no greater than a 2/10 to allow pt to sleep throughout the night without waking due to pain.     Time  6    Period  Weeks    Status  Partially Met   Pain is at a 4/10 in the mornings but subsides after moving around a little; sleeping is back to normal     PT LONG TERM GOAL #2   Title  PT left knee flexion to be to 120 degrees to allow pt to squat to pick something off of the floor .     Baseline  07/07/2018:  120    Time  6    Period  Weeks    Status  Achieved      PT LONG TERM GOAL #3   Title  PT left knee extension to be less than 3 degrees to allow normalized heel toe gait.     Baseline  07/07/2018:  5 degrees     Time  6    Period  Weeks    Status  On-going   5 degrees due to swelling.      PT LONG TERM GOAL #4   Title  PT to be able to walk in comfort for up to 45 minutes for return to work duties.     Time  6    Period  Weeks    Status  Partially Met   feels as if he could walk that much but he would be uncomfortable.      PT LONG TERM GOAL #5   Title  PT mm strength in LT LE to be at least 4+/5 to be able to go up and down steps in a reciprocal manner with one hand hold x 10 steps     Time  6    Period  Weeks    Status  Achieved      PT LONG TERM GOAL #6   Title  Pt to be able to single leg stance x 20 seconds on B LE to improve safety when walking on uneven terrain at work     Time  6    Period  Weeks    Status  Achieved            Plan - 07/21/18 1500    Clinical Impression Statement  Pt is going to return to MD tomorrow.  Therapist reassessed pt with overall improvement in all areas.  Therapist and pt agreed pt will be able to work on current limitations on his own.  PT will be discharged from skilled therapy at this time.     Clinical Presentation  Stable    Rehab Potential  Good    PT Frequency  3x / week    PT Duration  6 weeks    PT Treatment/Interventions  ADLs/Self Care Home  Management;Cryotherapy;Gait training;Stair training;Functional mobility training;Therapeutic activities;Therapeutic exercise;Balance training;Neuromuscular re-education;Patient/family education;Manual lymph drainage;Manual techniques;Passive range of motion    PT Next Visit Plan  Discharge to HEP     PT Home Exercise Plan  QS, modified SLR, heelslide, hip abduction and sitting LAQ; 06/27/2018 - supine and standing hamstring stretch, SLR - flexion       Patient will benefit from skilled therapeutic intervention in order to improve the following deficits and impairments:  Decreased activity tolerance, Decreased range of motion, Difficulty walking, Pain, Impaired flexibility  Visit Diagnosis: Acute pain of left knee  Stiffness of left knee, not elsewhere classified  Difficulty in walking, not elsewhere classified     Problem List Patient Active Problem List   Diagnosis Date Noted  . Osteoarthritis of left knee 06/11/2018  . Osteoarthritis of right knee 09/27/2016   Rayetta Humphrey, PT CLT [phone] 07/21/2018, 3:03 PM  [facility] [address] [place], [place], [address] Phone: [phone]   Fax:  [phone]  Name: Ricky Bonilla MRN: [id number] Date of Birth: 1950/11/03

## 2018-07-23 ENCOUNTER — Encounter (HOSPITAL_COMMUNITY): Payer: PRIVATE HEALTH INSURANCE | Admitting: Physical Therapy

## 2018-07-25 ENCOUNTER — Encounter (HOSPITAL_COMMUNITY): Payer: PRIVATE HEALTH INSURANCE

## 2019-02-26 IMAGING — DX DG KNEE 1-2V PORT*L*
2 series · 2 of 2 positions shown · non-contrast
Comparison: None.

CLINICAL DATA: Postop left knee replacement.

EXAM:
PORTABLE LEFT KNEE - 1-2 VIEW

[knee lat]
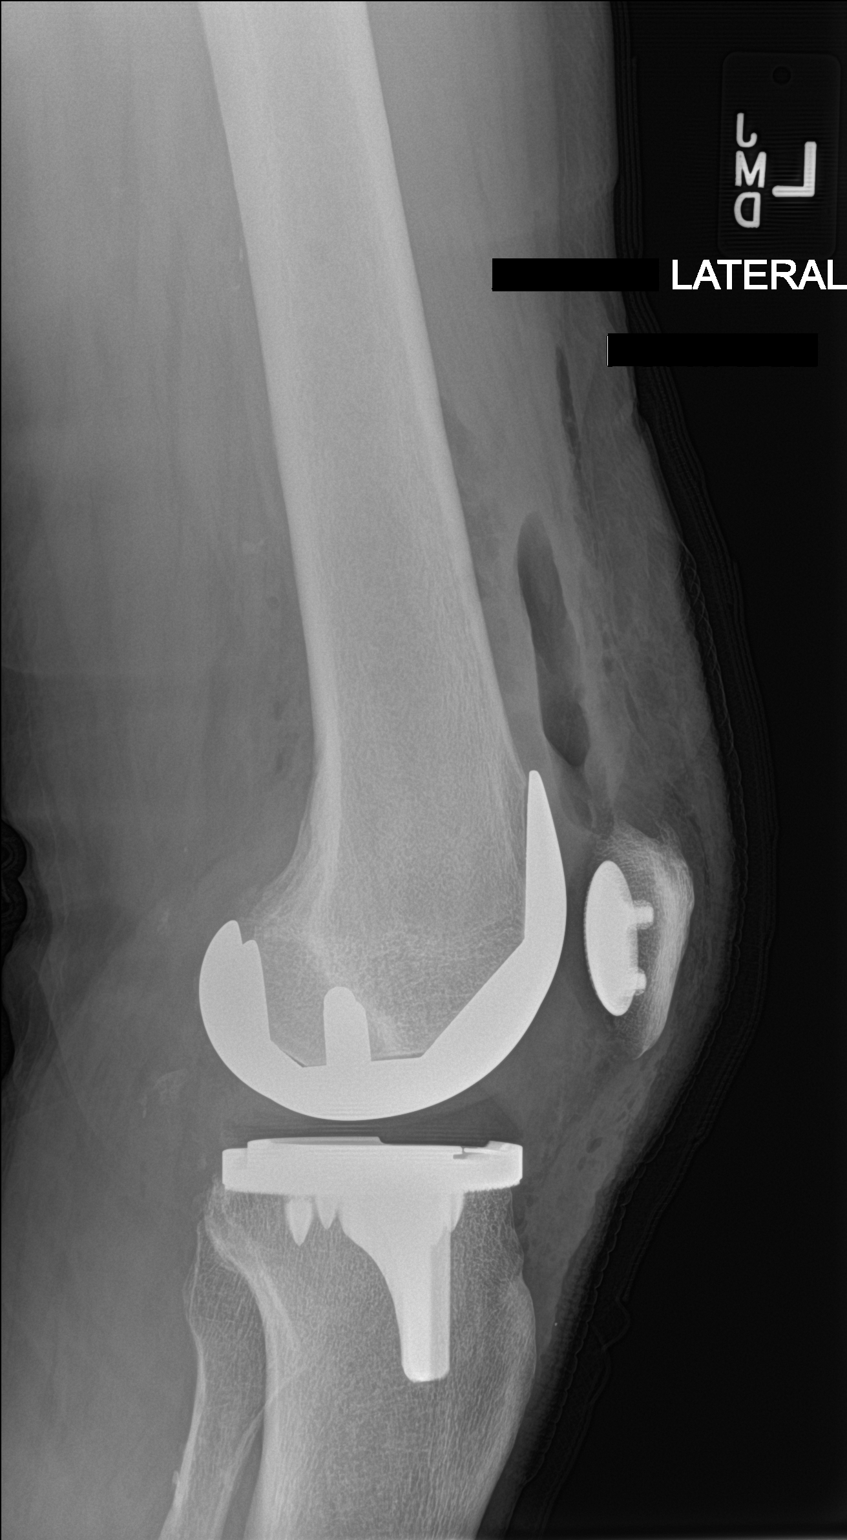

[knee ap]
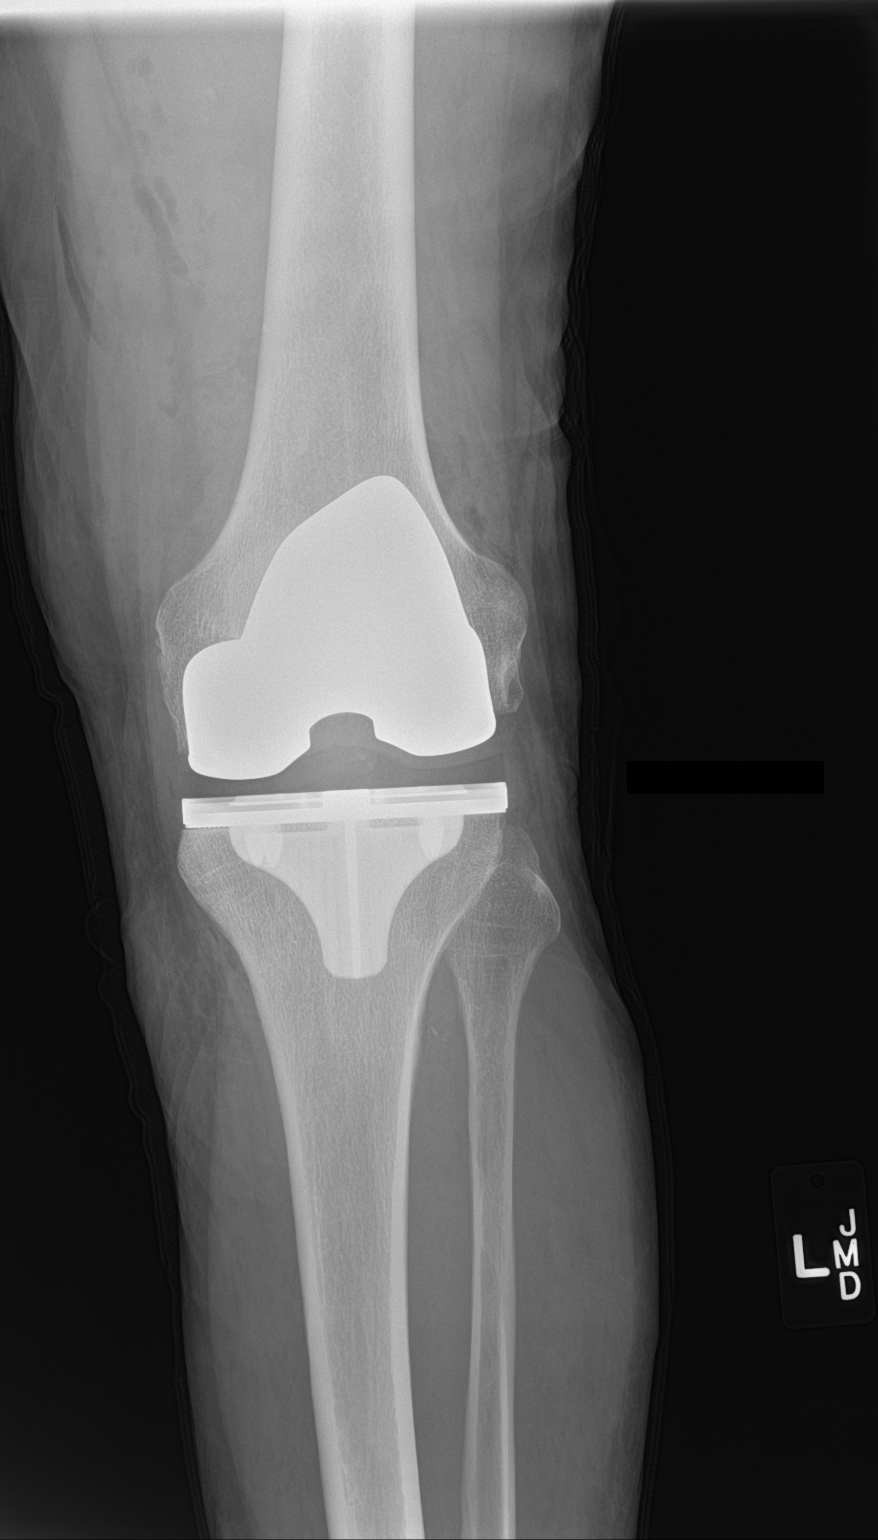

[2 of 2 positions shown; findings below may reference images not displayed]

FINDINGS: Post left total knee replacement. Alignment appears anatomic. No
evidence of hardware failure or loosening. No fracture or
dislocation. There is expected subcutaneous emphysema about the
operative site including intra-articular air. No radiopaque foreign
body.
IMPRESSION: Post left total knee replacement without evidence of complication.

## 2020-07-19 DIAGNOSIS — Z0000 Encounter for general adult medical examination without abnormal findings: Secondary | ICD-10-CM | POA: Diagnosis not present

## 2020-07-19 DIAGNOSIS — R251 Tremor, unspecified: Secondary | ICD-10-CM | POA: Diagnosis not present

## 2020-07-19 DIAGNOSIS — Z23 Encounter for immunization: Secondary | ICD-10-CM | POA: Diagnosis not present

## 2020-07-19 DIAGNOSIS — I10 Essential (primary) hypertension: Secondary | ICD-10-CM | POA: Diagnosis not present

## 2020-07-19 DIAGNOSIS — M25562 Pain in left knee: Secondary | ICD-10-CM | POA: Diagnosis not present

## 2020-07-19 DIAGNOSIS — K219 Gastro-esophageal reflux disease without esophagitis: Secondary | ICD-10-CM | POA: Diagnosis not present

## 2020-07-19 DIAGNOSIS — H9193 Unspecified hearing loss, bilateral: Secondary | ICD-10-CM | POA: Diagnosis not present

## 2021-08-08 DIAGNOSIS — Z23 Encounter for immunization: Secondary | ICD-10-CM | POA: Diagnosis not present

## 2021-08-17 DIAGNOSIS — Z23 Encounter for immunization: Secondary | ICD-10-CM | POA: Diagnosis not present

## 2021-08-17 DIAGNOSIS — R251 Tremor, unspecified: Secondary | ICD-10-CM | POA: Diagnosis not present

## 2021-08-17 DIAGNOSIS — Z0001 Encounter for general adult medical examination with abnormal findings: Secondary | ICD-10-CM | POA: Diagnosis not present

## 2021-08-17 DIAGNOSIS — I10 Essential (primary) hypertension: Secondary | ICD-10-CM | POA: Diagnosis not present

## 2021-08-17 DIAGNOSIS — Z1389 Encounter for screening for other disorder: Secondary | ICD-10-CM | POA: Diagnosis not present

## 2021-08-21 DIAGNOSIS — Z1329 Encounter for screening for other suspected endocrine disorder: Secondary | ICD-10-CM | POA: Diagnosis not present

## 2021-08-21 DIAGNOSIS — Z13220 Encounter for screening for lipoid disorders: Secondary | ICD-10-CM | POA: Diagnosis not present

## 2021-08-21 DIAGNOSIS — Z1159 Encounter for screening for other viral diseases: Secondary | ICD-10-CM | POA: Diagnosis not present

## 2021-08-21 DIAGNOSIS — Z131 Encounter for screening for diabetes mellitus: Secondary | ICD-10-CM | POA: Diagnosis not present

## 2021-08-21 DIAGNOSIS — K219 Gastro-esophageal reflux disease without esophagitis: Secondary | ICD-10-CM | POA: Diagnosis not present

## 2021-08-21 DIAGNOSIS — R5383 Other fatigue: Secondary | ICD-10-CM | POA: Diagnosis not present

## 2021-08-21 DIAGNOSIS — Z0000 Encounter for general adult medical examination without abnormal findings: Secondary | ICD-10-CM | POA: Diagnosis not present

## 2021-08-21 DIAGNOSIS — I10 Essential (primary) hypertension: Secondary | ICD-10-CM | POA: Diagnosis not present

## 2021-08-28 DIAGNOSIS — Z23 Encounter for immunization: Secondary | ICD-10-CM | POA: Diagnosis not present

## 2021-08-28 DIAGNOSIS — R251 Tremor, unspecified: Secondary | ICD-10-CM | POA: Diagnosis not present

## 2021-08-28 DIAGNOSIS — I10 Essential (primary) hypertension: Secondary | ICD-10-CM | POA: Diagnosis not present

## 2021-11-13 DIAGNOSIS — H52223 Regular astigmatism, bilateral: Secondary | ICD-10-CM | POA: Diagnosis not present

## 2021-11-13 DIAGNOSIS — H43813 Vitreous degeneration, bilateral: Secondary | ICD-10-CM | POA: Diagnosis not present

## 2021-11-13 DIAGNOSIS — H2513 Age-related nuclear cataract, bilateral: Secondary | ICD-10-CM | POA: Diagnosis not present

## 2021-11-13 DIAGNOSIS — H5213 Myopia, bilateral: Secondary | ICD-10-CM | POA: Diagnosis not present

## 2022-05-31 DIAGNOSIS — Z23 Encounter for immunization: Secondary | ICD-10-CM | POA: Diagnosis not present

## 2022-07-03 DIAGNOSIS — R251 Tremor, unspecified: Secondary | ICD-10-CM | POA: Diagnosis not present

## 2022-07-03 DIAGNOSIS — Z23 Encounter for immunization: Secondary | ICD-10-CM | POA: Diagnosis not present

## 2022-07-03 DIAGNOSIS — I10 Essential (primary) hypertension: Secondary | ICD-10-CM | POA: Diagnosis not present

## 2022-08-08 DIAGNOSIS — L570 Actinic keratosis: Secondary | ICD-10-CM | POA: Diagnosis not present

## 2022-08-08 DIAGNOSIS — R5383 Other fatigue: Secondary | ICD-10-CM | POA: Diagnosis not present

## 2022-08-08 DIAGNOSIS — I10 Essential (primary) hypertension: Secondary | ICD-10-CM | POA: Diagnosis not present

## 2022-08-08 DIAGNOSIS — E7800 Pure hypercholesterolemia, unspecified: Secondary | ICD-10-CM | POA: Diagnosis not present

## 2022-08-08 DIAGNOSIS — K219 Gastro-esophageal reflux disease without esophagitis: Secondary | ICD-10-CM | POA: Diagnosis not present

## 2022-08-24 DIAGNOSIS — H9193 Unspecified hearing loss, bilateral: Secondary | ICD-10-CM | POA: Diagnosis not present

## 2022-08-24 DIAGNOSIS — R251 Tremor, unspecified: Secondary | ICD-10-CM | POA: Diagnosis not present

## 2022-08-24 DIAGNOSIS — K219 Gastro-esophageal reflux disease without esophagitis: Secondary | ICD-10-CM | POA: Diagnosis not present

## 2022-08-24 DIAGNOSIS — Z0000 Encounter for general adult medical examination without abnormal findings: Secondary | ICD-10-CM | POA: Diagnosis not present

## 2022-08-24 DIAGNOSIS — I10 Essential (primary) hypertension: Secondary | ICD-10-CM | POA: Diagnosis not present

## 2022-08-24 DIAGNOSIS — Z23 Encounter for immunization: Secondary | ICD-10-CM | POA: Diagnosis not present

## 2022-10-25 DIAGNOSIS — H9193 Unspecified hearing loss, bilateral: Secondary | ICD-10-CM | POA: Diagnosis not present

## 2022-10-25 DIAGNOSIS — K219 Gastro-esophageal reflux disease without esophagitis: Secondary | ICD-10-CM | POA: Diagnosis not present

## 2022-10-25 DIAGNOSIS — R251 Tremor, unspecified: Secondary | ICD-10-CM | POA: Diagnosis not present

## 2022-10-25 DIAGNOSIS — I10 Essential (primary) hypertension: Secondary | ICD-10-CM | POA: Diagnosis not present

## 2022-11-08 DIAGNOSIS — K219 Gastro-esophageal reflux disease without esophagitis: Secondary | ICD-10-CM | POA: Diagnosis not present

## 2022-11-08 DIAGNOSIS — R251 Tremor, unspecified: Secondary | ICD-10-CM | POA: Diagnosis not present

## 2022-11-08 DIAGNOSIS — I10 Essential (primary) hypertension: Secondary | ICD-10-CM | POA: Diagnosis not present

## 2022-11-08 DIAGNOSIS — Z1389 Encounter for screening for other disorder: Secondary | ICD-10-CM | POA: Diagnosis not present

## 2022-11-08 DIAGNOSIS — H9193 Unspecified hearing loss, bilateral: Secondary | ICD-10-CM | POA: Diagnosis not present

## 2022-11-22 DIAGNOSIS — I10 Essential (primary) hypertension: Secondary | ICD-10-CM | POA: Diagnosis not present

## 2022-11-22 DIAGNOSIS — R5383 Other fatigue: Secondary | ICD-10-CM | POA: Diagnosis not present

## 2023-01-15 DIAGNOSIS — H2513 Age-related nuclear cataract, bilateral: Secondary | ICD-10-CM | POA: Diagnosis not present

## 2023-02-06 DIAGNOSIS — K219 Gastro-esophageal reflux disease without esophagitis: Secondary | ICD-10-CM | POA: Diagnosis not present

## 2023-02-06 DIAGNOSIS — I10 Essential (primary) hypertension: Secondary | ICD-10-CM | POA: Diagnosis not present

## 2023-02-06 DIAGNOSIS — Z0001 Encounter for general adult medical examination with abnormal findings: Secondary | ICD-10-CM | POA: Diagnosis not present

## 2023-02-06 DIAGNOSIS — R251 Tremor, unspecified: Secondary | ICD-10-CM | POA: Diagnosis not present

## 2023-02-06 DIAGNOSIS — H9193 Unspecified hearing loss, bilateral: Secondary | ICD-10-CM | POA: Diagnosis not present

## 2023-02-06 DIAGNOSIS — Z23 Encounter for immunization: Secondary | ICD-10-CM | POA: Diagnosis not present

## 2023-05-06 DIAGNOSIS — I10 Essential (primary) hypertension: Secondary | ICD-10-CM | POA: Diagnosis not present

## 2023-05-06 DIAGNOSIS — K219 Gastro-esophageal reflux disease without esophagitis: Secondary | ICD-10-CM | POA: Diagnosis not present

## 2023-05-07 DIAGNOSIS — R5383 Other fatigue: Secondary | ICD-10-CM | POA: Diagnosis not present

## 2023-05-07 DIAGNOSIS — K219 Gastro-esophageal reflux disease without esophagitis: Secondary | ICD-10-CM | POA: Diagnosis not present

## 2023-05-07 DIAGNOSIS — E7800 Pure hypercholesterolemia, unspecified: Secondary | ICD-10-CM | POA: Diagnosis not present

## 2023-05-08 DIAGNOSIS — H9193 Unspecified hearing loss, bilateral: Secondary | ICD-10-CM | POA: Diagnosis not present

## 2023-05-08 DIAGNOSIS — R251 Tremor, unspecified: Secondary | ICD-10-CM | POA: Diagnosis not present

## 2023-05-08 DIAGNOSIS — K219 Gastro-esophageal reflux disease without esophagitis: Secondary | ICD-10-CM | POA: Diagnosis not present

## 2023-05-08 DIAGNOSIS — I10 Essential (primary) hypertension: Secondary | ICD-10-CM | POA: Diagnosis not present

## 2023-06-28 DIAGNOSIS — M545 Low back pain, unspecified: Secondary | ICD-10-CM | POA: Diagnosis not present

## 2023-07-08 DIAGNOSIS — M545 Low back pain, unspecified: Secondary | ICD-10-CM | POA: Diagnosis not present

## 2023-07-11 DIAGNOSIS — M545 Low back pain, unspecified: Secondary | ICD-10-CM | POA: Diagnosis not present

## 2023-07-25 DIAGNOSIS — Z23 Encounter for immunization: Secondary | ICD-10-CM | POA: Diagnosis not present

## 2023-08-02 DIAGNOSIS — R059 Cough, unspecified: Secondary | ICD-10-CM | POA: Diagnosis not present

## 2023-09-02 DIAGNOSIS — E7849 Other hyperlipidemia: Secondary | ICD-10-CM | POA: Diagnosis not present

## 2023-09-02 DIAGNOSIS — I10 Essential (primary) hypertension: Secondary | ICD-10-CM | POA: Diagnosis not present

## 2023-09-02 DIAGNOSIS — E559 Vitamin D deficiency, unspecified: Secondary | ICD-10-CM | POA: Diagnosis not present

## 2023-09-02 DIAGNOSIS — K219 Gastro-esophageal reflux disease without esophagitis: Secondary | ICD-10-CM | POA: Diagnosis not present

## 2023-09-02 DIAGNOSIS — R739 Hyperglycemia, unspecified: Secondary | ICD-10-CM | POA: Diagnosis not present

## 2023-09-02 DIAGNOSIS — E039 Hypothyroidism, unspecified: Secondary | ICD-10-CM | POA: Diagnosis not present

## 2023-09-10 DIAGNOSIS — Z0001 Encounter for general adult medical examination with abnormal findings: Secondary | ICD-10-CM | POA: Diagnosis not present

## 2023-09-10 DIAGNOSIS — H9193 Unspecified hearing loss, bilateral: Secondary | ICD-10-CM | POA: Diagnosis not present

## 2023-09-10 DIAGNOSIS — I10 Essential (primary) hypertension: Secondary | ICD-10-CM | POA: Diagnosis not present

## 2023-09-10 DIAGNOSIS — R251 Tremor, unspecified: Secondary | ICD-10-CM | POA: Diagnosis not present

## 2023-09-10 DIAGNOSIS — K219 Gastro-esophageal reflux disease without esophagitis: Secondary | ICD-10-CM | POA: Diagnosis not present

## 2023-09-18 DIAGNOSIS — Z23 Encounter for immunization: Secondary | ICD-10-CM | POA: Diagnosis not present

## 2023-10-08 DIAGNOSIS — K219 Gastro-esophageal reflux disease without esophagitis: Secondary | ICD-10-CM | POA: Diagnosis not present

## 2023-10-08 DIAGNOSIS — I10 Essential (primary) hypertension: Secondary | ICD-10-CM | POA: Diagnosis not present

## 2023-10-08 DIAGNOSIS — H9193 Unspecified hearing loss, bilateral: Secondary | ICD-10-CM | POA: Diagnosis not present

## 2023-10-08 DIAGNOSIS — R251 Tremor, unspecified: Secondary | ICD-10-CM | POA: Diagnosis not present

## 2023-11-04 DIAGNOSIS — D225 Melanocytic nevi of trunk: Secondary | ICD-10-CM | POA: Diagnosis not present

## 2023-11-04 DIAGNOSIS — L821 Other seborrheic keratosis: Secondary | ICD-10-CM | POA: Diagnosis not present

## 2023-11-04 DIAGNOSIS — D485 Neoplasm of uncertain behavior of skin: Secondary | ICD-10-CM | POA: Diagnosis not present

## 2023-11-04 DIAGNOSIS — B078 Other viral warts: Secondary | ICD-10-CM | POA: Diagnosis not present

## 2023-11-04 DIAGNOSIS — L218 Other seborrheic dermatitis: Secondary | ICD-10-CM | POA: Diagnosis not present

## 2023-11-21 DIAGNOSIS — L988 Other specified disorders of the skin and subcutaneous tissue: Secondary | ICD-10-CM | POA: Diagnosis not present

## 2023-11-21 DIAGNOSIS — D485 Neoplasm of uncertain behavior of skin: Secondary | ICD-10-CM | POA: Diagnosis not present

## 2023-12-12 DIAGNOSIS — L928 Other granulomatous disorders of the skin and subcutaneous tissue: Secondary | ICD-10-CM | POA: Diagnosis not present

## 2023-12-12 DIAGNOSIS — Z1283 Encounter for screening for malignant neoplasm of skin: Secondary | ICD-10-CM | POA: Diagnosis not present

## 2023-12-12 DIAGNOSIS — C4441 Basal cell carcinoma of skin of scalp and neck: Secondary | ICD-10-CM | POA: Diagnosis not present

## 2023-12-12 DIAGNOSIS — D485 Neoplasm of uncertain behavior of skin: Secondary | ICD-10-CM | POA: Diagnosis not present

## 2023-12-12 DIAGNOSIS — L820 Inflamed seborrheic keratosis: Secondary | ICD-10-CM | POA: Diagnosis not present

## 2023-12-12 DIAGNOSIS — D225 Melanocytic nevi of trunk: Secondary | ICD-10-CM | POA: Diagnosis not present

## 2024-01-06 DIAGNOSIS — R251 Tremor, unspecified: Secondary | ICD-10-CM | POA: Diagnosis not present

## 2024-01-06 DIAGNOSIS — E7800 Pure hypercholesterolemia, unspecified: Secondary | ICD-10-CM | POA: Diagnosis not present

## 2024-01-06 DIAGNOSIS — K219 Gastro-esophageal reflux disease without esophagitis: Secondary | ICD-10-CM | POA: Diagnosis not present

## 2024-01-06 DIAGNOSIS — I10 Essential (primary) hypertension: Secondary | ICD-10-CM | POA: Diagnosis not present

## 2024-01-23 DIAGNOSIS — D225 Melanocytic nevi of trunk: Secondary | ICD-10-CM | POA: Diagnosis not present

## 2024-01-23 DIAGNOSIS — C4441 Basal cell carcinoma of skin of scalp and neck: Secondary | ICD-10-CM | POA: Diagnosis not present

## 2024-03-28 ENCOUNTER — Encounter (HOSPITAL_COMMUNITY): Payer: Self-pay

## 2024-03-28 ENCOUNTER — Other Ambulatory Visit: Payer: Self-pay

## 2024-03-28 ENCOUNTER — Emergency Department (HOSPITAL_COMMUNITY)

## 2024-03-28 ENCOUNTER — Emergency Department (HOSPITAL_COMMUNITY)
Admission: EM | Admit: 2024-03-28 | Discharge: 2024-03-29 | Disposition: A | Discharge status: Home / Self Care | Attending: Emergency Medicine | Admitting: Emergency Medicine

## 2024-03-28 DIAGNOSIS — M25342 Other instability, left hand: Secondary | ICD-10-CM | POA: Diagnosis not present

## 2024-03-28 DIAGNOSIS — S61012A Laceration without foreign body of left thumb without damage to nail, initial encounter: Secondary | ICD-10-CM

## 2024-03-28 DIAGNOSIS — M7989 Other specified soft tissue disorders: Secondary | ICD-10-CM | POA: Diagnosis not present

## 2024-03-28 DIAGNOSIS — S62611A Displaced fracture of proximal phalanx of left index finger, initial encounter for closed fracture: Secondary | ICD-10-CM | POA: Diagnosis not present

## 2024-03-28 DIAGNOSIS — Z7401 Bed confinement status: Secondary | ICD-10-CM | POA: Diagnosis not present

## 2024-03-28 DIAGNOSIS — S68113A Complete traumatic metacarpophalangeal amputation of left middle finger, initial encounter: Secondary | ICD-10-CM | POA: Insufficient documentation

## 2024-03-28 DIAGNOSIS — Z87891 Personal history of nicotine dependence: Secondary | ICD-10-CM | POA: Insufficient documentation

## 2024-03-28 DIAGNOSIS — S62611B Displaced fracture of proximal phalanx of left index finger, initial encounter for open fracture: Secondary | ICD-10-CM | POA: Diagnosis not present

## 2024-03-28 DIAGNOSIS — I10 Essential (primary) hypertension: Secondary | ICD-10-CM | POA: Insufficient documentation

## 2024-03-28 DIAGNOSIS — S68613A Complete traumatic transphalangeal amputation of left middle finger, initial encounter: Secondary | ICD-10-CM | POA: Diagnosis not present

## 2024-03-28 DIAGNOSIS — M1990 Unspecified osteoarthritis, unspecified site: Secondary | ICD-10-CM | POA: Insufficient documentation

## 2024-03-28 DIAGNOSIS — S62522A Displaced fracture of distal phalanx of left thumb, initial encounter for closed fracture: Secondary | ICD-10-CM | POA: Diagnosis not present

## 2024-03-28 DIAGNOSIS — S62522B Displaced fracture of distal phalanx of left thumb, initial encounter for open fracture: Secondary | ICD-10-CM | POA: Insufficient documentation

## 2024-03-28 DIAGNOSIS — Z23 Encounter for immunization: Secondary | ICD-10-CM | POA: Insufficient documentation

## 2024-03-28 DIAGNOSIS — W312XXA Contact with powered woodworking and forming machines, initial encounter: Secondary | ICD-10-CM | POA: Diagnosis not present

## 2024-03-28 DIAGNOSIS — S66022A Laceration of long flexor muscle, fascia and tendon of left thumb at wrist and hand level, initial encounter: Secondary | ICD-10-CM | POA: Diagnosis not present

## 2024-03-28 LAB — CBC WITH DIFFERENTIAL/PLATELET
Abs Immature Granulocytes: 0.03 10*3/uL (ref 0.00–0.07)
Basophils Absolute: 0 10*3/uL (ref 0.0–0.1)
Basophils Relative: 0 %
Eosinophils Absolute: 0.2 10*3/uL (ref 0.0–0.5)
Eosinophils Relative: 2 %
HCT: 37.1 % — ABNORMAL LOW (ref 39.0–52.0)
Hemoglobin: 12.5 g/dL — ABNORMAL LOW (ref 13.0–17.0)
Immature Granulocytes: 0 %
Lymphocytes Relative: 17 %
Lymphs Abs: 1.5 10*3/uL (ref 0.7–4.0)
MCH: 31.3 pg (ref 26.0–34.0)
MCHC: 33.7 g/dL (ref 30.0–36.0)
MCV: 93 fL (ref 80.0–100.0)
Monocytes Absolute: 0.7 10*3/uL (ref 0.1–1.0)
Monocytes Relative: 7 %
Neutro Abs: 6.5 10*3/uL (ref 1.7–7.7)
Neutrophils Relative %: 74 %
Platelets: 158 10*3/uL (ref 150–400)
RBC: 3.99 MIL/uL — ABNORMAL LOW (ref 4.22–5.81)
RDW: 13.1 % (ref 11.5–15.5)
WBC: 8.9 10*3/uL (ref 4.0–10.5)
nRBC: 0 % (ref 0.0–0.2)

## 2024-03-28 LAB — BASIC METABOLIC PANEL WITH GFR
Anion gap: 12 (ref 5–15)
BUN: 24 mg/dL — ABNORMAL HIGH (ref 8–23)
CO2: 20 mmol/L — ABNORMAL LOW (ref 22–32)
Calcium: 8.8 mg/dL — ABNORMAL LOW (ref 8.9–10.3)
Chloride: 106 mmol/L (ref 98–111)
Creatinine, Ser: 1.15 mg/dL (ref 0.61–1.24)
GFR, Estimated: >60 mL/min (ref >60)
Glucose, Bld: 107 mg/dL — ABNORMAL HIGH (ref 70–99)
Potassium: 4.1 mmol/L (ref 3.5–5.1)
Sodium: 138 mmol/L (ref 135–145)

## 2024-03-28 LAB — PROTIME-INR
INR: 1 (ref 0.8–1.2)
Prothrombin Time: 13.5 s (ref 11.4–15.2)

## 2024-03-28 MED ORDER — CEFAZOLIN SODIUM-DEXTROSE 2-4 GM/100ML-% IV SOLN
2.0000 g | Freq: Once | INTRAVENOUS | Status: AC
  Administered 2024-03-28: 2 g via INTRAVENOUS
  Filled 2024-03-28: qty 100

## 2024-03-28 MED ORDER — TETANUS-DIPHTH-ACELL PERTUSSIS 5-2.5-18.5 LF-MCG/0.5 IM SUSY
0.5000 mL | PREFILLED_SYRINGE | Freq: Once | INTRAMUSCULAR | Status: AC
  Administered 2024-03-28: 0.5 mL via INTRAMUSCULAR
  Filled 2024-03-28: qty 0.5

## 2024-03-28 MED ORDER — CEFAZOLIN SODIUM 1 G IJ SOLR
2.0000 g | Freq: Once | INTRAMUSCULAR | Status: DC
  Filled 2024-03-28: qty 20

## 2024-03-28 MED ORDER — FENTANYL CITRATE PF 50 MCG/ML IJ SOSY
50.0000 ug | PREFILLED_SYRINGE | Freq: Once | INTRAMUSCULAR | Status: AC
  Administered 2024-03-28: 50 ug via INTRAVENOUS
  Filled 2024-03-28: qty 1

## 2024-03-28 MED ORDER — LIDOCAINE HCL (PF) 1 % IJ SOLN
30.0000 mL | Freq: Once | INTRAMUSCULAR | Status: AC
  Administered 2024-03-28: 30 mL
  Filled 2024-03-28: qty 30

## 2024-03-28 MED ORDER — MORPHINE SULFATE (PF) 4 MG/ML IV SOLN
4.0000 mg | Freq: Once | INTRAVENOUS | Status: AC
  Administered 2024-03-28: 4 mg via INTRAVENOUS
  Filled 2024-03-28: qty 1

## 2024-03-28 NOTE — ED Provider Notes (Signed)
 Patient transferred from outside hospital with left third finger amputation as well as thumb laceration from a table saw. Wound wrapped.  Distal fingertip perfusion intact.  Bleeding controlled.  He received antibiotics and tetanus.  Dr. Delene of hand surgeon notified of patient arrival. Plan to take to the OR this morning possibly around 2 AM.   Carita Senior, MD 03/29/24 [phone]

## 2024-03-28 NOTE — ED Notes (Signed)
 Report given to  [facility] Charge Nurse Laymon RN of ED.

## 2024-03-28 NOTE — ED Notes (Signed)
 [facility] called for transport.

## 2024-03-28 NOTE — Consult Note (Signed)
 Orthopaedic Surgery Hand and Upper Extremity History and Physical Examination 03/28/2024   CC: Left hand saw injury  HPI: Ricky Bonilla is a 73 y.o. male who sustained a saw injury to the dorsal left hand.  He amputated the long finger at the time and it has not been brought.  He was evaluated at [facility] and then sent to [facility].   Past Medical History: Past Medical History:  Diagnosis Date   Hypertension    OA (osteoarthritis)    left knee   Wears glasses    Wears hearing aid in both ears    Wears partial dentures    upper     Medications: Scheduled Meds:  Tdap  0.5 mL Intramuscular Once   Continuous Infusions: PRN Meds:.  Allergies: Allergies as of 03/28/2024   (No Known Allergies)    Past Surgical History: Past Surgical History:  Procedure Laterality Date   APPENDECTOMY  1979 approx.   COLONOSCOPY N/A 08/07/2016   Procedure: COLONOSCOPY;  Surgeon: Oneil Budge, MD;  [facility];  Service: Gastroenterology;  Laterality: N/A;   KNEE ARTHROPLASTY Right 09/27/2016   Procedure: RIGHT TOTAL KNEE ARTHROPLASTY WITH COMPUTER NAVIGATION;  Surgeon: Redell Shoals, MD;  Location: [facility];  Service: Orthopedics;  Laterality: Right;  Combined spinal/epidural+regional block   KNEE ARTHROPLASTY Left 06/11/2018   Procedure: LEFT  TOTAL KNEE ARTHROPLASTY WITH COMPUTER NAVIGATION;  Surgeon: Shoals Redell, MD;  Location: [facility];  Service: Orthopedics;  Laterality: Left;  Needs RNFA   ROTATOR CUFF REPAIR Bilateral 2011 approx.     Social History: Social History   Occupational History   Not on file  Tobacco Use   Smoking status: Former    Current packs/day: 0.00    Average packs/day: 1 pack/day for 8.0 years (8.0 ttl pk-yrs)    Types: Cigarettes    Start date: 07/25/1969    Quit date: 07/25/1977    Years since quitting: 46.7   Smokeless tobacco: Former    Types: Snuff    Quit date: 07/20/1989  Vaping Use   Vaping status: Never Used  Substance and Sexual  Activity   Alcohol  use: Yes    Comment: occasional   Drug use: No   Sexual activity: Not on file     Family History: Family History  Problem Relation Age of Onset   Colon cancer Neg Hx    Otherwise, no relevant orthopaedic family history  ROS: Review of Systems: All systems reviewed and are negative except that mentioned in HPI  Work/Sport/Hobbies: See HPI  Physical Examination: Vitals:   03/28/24 2200 03/28/24 2257  BP: (!) 155/89 (!) 186/86  Pulse: (!) 58 67  Resp: 11 14  Temp:  98.7 F (37.1 C)  SpO2: 99% 100%   Constitutional: Awake, alert.  WN/WD Appearance: healthy, no acute distress, well-groomed Affect: Normal HEENT: EOMI, mucous membranes moist CV: RRR Pulm: breathing comfortably   Left Upper Extremity / Hand Amputation of long finger.  Intact sensation radial and ulnar aspects of index, ring, and small fingers.  No sensation on radial aspect of tip of thumb. Reports intact sensation on ulnar aspect of thumb.   Pertinent Labs: n/a  Imaging: I have personally reviewed the following studies: Left hand X-rays demonstrate ampputation of left long and fracture of left index proximal phalanx  Additional Studies: n/a  Assessment/Plan: Ricky Bonilla is a 73 y.o. male with left long finger traumatic amputation and left index finger open fracture of proximal phalanx from table saw.  Plan for OR for I&D, revision amputation of long finger and perc pinning of index.  We discussed risks, benefits, and alternatives including 3rd ray resection.  Patient elected to proceed.  He preferred long finger revision level to remain as long as possible.   Marsa Christen, MD Hand and Upper Extremity Surgery [facility] [phone] 03/28/2024 11:20 PM

## 2024-03-28 NOTE — ED Triage Notes (Signed)
 Pt reports:  Middle finger amputation  Using a hand saw Finger not found in triage

## 2024-03-28 NOTE — ED Provider Notes (Signed)
  EMERGENCY DEPARTMENT AT [facility] Provider Note   CSN: [id number] Arrival date & time: 03/28/24  1952     Patient presents with: Hand Injury   Ricky Bonilla is a 73 y.o. male.  He is right-hand dominant.  Complaining of injury to left hand using a table saw just prior to arrival.  Appears to have an amputation of his left third digit and significant laceration of his thumb.  Complaining of moderate pain.  Denies any other injuries.  Non-smoker.  Wife brought him here, amputated digit not brought with him.  He is up-to-date on his tetanus.  {Add pertinent medical, surgical, social history, OB history to YEP:67052} The history is provided by the patient and the spouse.  Hand Injury Location:  Hand and finger Finger location:  L middle finger and L thumb Injury: yes   Time since incident:  1 hour Mechanism of injury: amputation   Amputation:    Extent:  Complete   Cause: table saw. Pain details:    Quality:  Throbbing   Severity:  Moderate   Onset quality:  Sudden   Timing:  Constant   Progression:  Unchanged Handedness:  Right-handed Relieved by:  None tried      Prior to Admission medications   Medication Sig Start Date End Date Taking? Authorizing Provider  amLODipine  (NORVASC ) 10 MG tablet Take 10 mg by mouth every morning.     [provider]  aspirin  81 MG chewable tablet Chew 1 tablet (81 mg total) by mouth 2 (two) times daily. 06/12/18   Swinteck, Redell, MD  docusate sodium  (COLACE) 100 MG capsule Take 1 capsule (100 mg total) by mouth 2 (two) times daily. 06/12/18   Swinteck, Redell, MD  fosinopril  (MONOPRIL ) 40 MG tablet Take 40 mg by mouth every morning.     [provider]  HYDROcodone -acetaminophen  (NORCO/VICODIN) 5-325 MG tablet Take 1-2 tablets by mouth every 6 (six) hours as needed (prn postop knee pain). 06/12/18   Swinteck, Redell, MD  Multiple Vitamin (MULTIVITAMIN WITH MINERALS) TABS tablet Take 1 tablet by mouth daily.     [provider]  Omega-3 Fatty Acids (FISH OIL PO) Take 1 capsule by mouth daily.    [provider]  ondansetron  (ZOFRAN ) 4 MG tablet Take 1 tablet (4 mg total) by mouth every 6 (six) hours as needed for nausea. 06/12/18   Swinteck, Redell, MD  propranolol  (INDERAL ) 80 MG tablet Take 40-80 mg by mouth daily as needed (tremors).  07/16/16   [provider]    Allergies: Patient has no known allergies.    Review of Systems  Updated Vital Signs BP (!) 160/100   Pulse (!) 57   Resp 18   SpO2 96%   Physical Exam Vitals and nursing note reviewed.  Constitutional:      Appearance: Normal appearance. He is well-developed.  HENT:     Head: Normocephalic and atraumatic.   Eyes:     Conjunctiva/sclera: Conjunctivae normal.    Cardiovascular:     Rate and Rhythm: Normal rate and regular rhythm.     Heart sounds: No murmur heard. Pulmonary:     Effort: Pulmonary effort is normal. No respiratory distress.     Breath sounds: Normal breath sounds.  Abdominal:     Palpations: Abdomen is soft.     Tenderness: There is no abdominal tenderness.   Musculoskeletal:     Cervical back: Neck supple.     Comments: Patient has a full  amputation of his left ring finger at the level of the proximal phalanx.  There is a complex laceration across the palmar aspect of the thumb.  Laceration of the second digit   Skin:    General: Skin is warm and dry.   Neurological:     Mental Status: He is alert.     GCS: GCS eye subscore is 4. GCS verbal subscore is 5. GCS motor subscore is 6.          (all labs ordered are listed, but only abnormal results are displayed) Labs Reviewed  BASIC METABOLIC PANEL WITH GFR - Abnormal; Notable for the following components:      Result Value   CO2 20 (*)    Glucose, Bld 107 (*)    BUN 24 (*)    Calcium 8.8 (*)    All other components within normal limits  CBC WITH DIFFERENTIAL/PLATELET - Abnormal; Notable for the following  components:   RBC 3.99 (*)    Hemoglobin 12.5 (*)    HCT 37.1 (*)    All other components within normal limits  PROTIME-INR  CBC WITH DIFFERENTIAL/PLATELET    EKG: None  Radiology: DG Hand Complete Left Result Date: 03/28/2024 CLINICAL DATA:  middle finger amputation from a hand saw. EXAM: LEFT HAND - COMPLETE 3+ VIEW COMPARISON:  None Available. FINDINGS: Traumatic amputation of the mid diaphysis of the third finger proximal phalanx. Oblique mildly displaced fracture through the second finger proximal phalanx. Soft tissue swelling about the second finger. Artifact obscures bony detail. IMPRESSION: 1. Traumatic amputation of the third finger proximal phalanx at the mid diaphysis. 2. Oblique mildly displaced fracture through the second finger proximal phalanx. Electronically Signed   By: Norman Gatlin M.D.   On: 03/28/2024 20:57    {Document cardiac monitor, telemetry assessment procedure when appropriate:32947} Procedures   Medications Ordered in the ED  Tdap (BOOSTRIX) injection 0.5 mL (has no administration in time range)  fentaNYL  (SUBLIMAZE ) injection 50 mcg (50 mcg Intravenous Given 03/28/24 2102)  lidocaine (PF) (XYLOCAINE) 1 % injection 30 mL (30 mLs Other Given by Other 03/28/24 2014)  ceFAZolin  (ANCEF ) IVPB 2g/100 mL premix (0 g Intravenous Stopped 03/28/24 2223)  morphine  (PF) 4 MG/ML injection 4 mg (4 mg Intravenous Given 03/28/24 2216)    Clinical Course as of 03/28/24 2315  Sat Mar 28, 2024  2100 I put some wet dressings over his wounds and put an Ace wrap over them.  Discussed with hand surgery Dr. Delene.  He asked that the patient be n.p.o. and transferred down to the [facility], ED where he will evaluate the patient for possible operative repair.  Reviewed with patient and wife. [MB]  2113 Patient accepted by Dr. Patsey to [facility] via [facility]. [MB]    Clinical Course User Index [MB] Towana Ozell BROCKS, MD   {Click here for ABCD2, HEART and other calculators  REFRESH Note before signing:1}                              Medical Decision Making Amount and/or Complexity of Data Reviewed Labs: ordered. Radiology: ordered.  Risk Prescription drug management.   This patient complains of left hand injury; this involves an extensive number of treatment Options and is a complaint that carries with it a high risk of complications and morbidity. The differential includes fracture, amputation, dislocation, tendon injury  I ordered, reviewed and interpreted labs, which included CBC with mildly low  hemoglobin, chemistries with low bicarb elevated glucose I ordered medication IV antibiotics IV pain medicine and reviewed PMP when indicated. I ordered imaging studies which included x-rays of left hand and I independently    visualized and interpreted imaging which showed fracture of second proximal phalanx and amputation of third phalanx at proximal. Additional history obtained from patient's wife Previous records obtained and reviewed in epic no recent admissions I consulted hand surgery Dr. Jeanmarie and discussed lab and imaging findings and discussed disposition.  Cardiac monitoring reviewed, sinus rhythm Social determinants considered, no significant barriers Critical Interventions: None  After the interventions stated above, I reevaluated the patient and found patient to be symptomatically improved and bleeding controlled Admission and further testing considered, patient will be transferred to [facility] campus for further evaluation and management by hand surgery.  Patient in agreement with plan for transfer.   {Document critical care time when appropriate  Document review of labs and clinical decision tools ie CHADS2VASC2, etc  Document your independent review of radiology images and any outside records  Document your discussion with family members, caretakers and with consultants  Document social determinants of health affecting pt's care  Document  your decision making why or why not admission, treatments were needed:32947:::1}   Final diagnoses:  Amputation of left middle finger  Open displaced fracture of proximal phalanx of left index finger, initial encounter  Laceration of left thumb with complication, initial encounter    ED Discharge Orders     None

## 2024-03-29 ENCOUNTER — Emergency Department (HOSPITAL_COMMUNITY): Admitting: Anesthesiology

## 2024-03-29 ENCOUNTER — Other Ambulatory Visit: Payer: Self-pay

## 2024-03-29 ENCOUNTER — Encounter (HOSPITAL_COMMUNITY)
Admission: EM | Disposition: A | Discharge status: Home / Self Care | Payer: Self-pay | Source: Home / Self Care | Attending: Emergency Medicine

## 2024-03-29 DIAGNOSIS — S62601B Fracture of unspecified phalanx of left index finger, initial encounter for open fracture: Secondary | ICD-10-CM | POA: Diagnosis not present

## 2024-03-29 DIAGNOSIS — S68613A Complete traumatic transphalangeal amputation of left middle finger, initial encounter: Secondary | ICD-10-CM | POA: Diagnosis not present

## 2024-03-29 DIAGNOSIS — S68113A Complete traumatic metacarpophalangeal amputation of left middle finger, initial encounter: Secondary | ICD-10-CM

## 2024-03-29 HISTORY — PX: INCISION AND DRAINAGE OF WOUND: SHX1803

## 2024-03-29 HISTORY — PX: AMPUTATION FINGER: SHX6594

## 2024-03-29 SURGERY — AMPUTATION, FINGER
Anesthesia: General | Site: Hand | Laterality: Left

## 2024-03-29 MED ORDER — BUPIVACAINE HCL (PF) 0.25 % IJ SOLN
INTRAMUSCULAR | Status: AC
  Filled 2024-03-29: qty 30

## 2024-03-29 MED ORDER — FENTANYL CITRATE (PF) 100 MCG/2ML IJ SOLN: 25.0000 ug | INTRAMUSCULAR | Status: DC | PRN

## 2024-03-29 MED ORDER — CEFAZOLIN SODIUM-DEXTROSE 2-4 GM/100ML-% IV SOLN
INTRAVENOUS | Status: AC
  Filled 2024-03-29: qty 100

## 2024-03-29 MED ORDER — LIDOCAINE HCL (PF) 1 % IJ SOLN
INTRAMUSCULAR | Status: DC | PRN
  Administered 2024-03-29: 19 mL

## 2024-03-29 MED ORDER — ACETAMINOPHEN 10 MG/ML IV SOLN: 1000.0000 mg | Freq: Once | INTRAVENOUS | Status: DC | PRN

## 2024-03-29 MED ORDER — PROPOFOL 10 MG/ML IV BOLUS
INTRAVENOUS | Status: AC
  Filled 2024-03-29: qty 20

## 2024-03-29 MED ORDER — PHENYLEPHRINE HCL (PRESSORS) 10 MG/ML IV SOLN
INTRAVENOUS | Status: DC | PRN
  Administered 2024-03-29: 60 ug via INTRAVENOUS

## 2024-03-29 MED ORDER — FENTANYL CITRATE (PF) 250 MCG/5ML IJ SOLN
INTRAMUSCULAR | Status: DC | PRN
  Administered 2024-03-29 (×5): 50 ug via INTRAVENOUS

## 2024-03-29 MED ORDER — SUGAMMADEX SODIUM 200 MG/2ML IV SOLN
INTRAVENOUS | Status: DC | PRN
  Administered 2024-03-29: 200 mg via INTRAVENOUS

## 2024-03-29 MED ORDER — LACTATED RINGERS IV SOLN: INTRAVENOUS | Status: DC | PRN

## 2024-03-29 MED ORDER — PROPOFOL 10 MG/ML IV BOLUS
INTRAVENOUS | Status: DC | PRN
  Administered 2024-03-29: 170 mg via INTRAVENOUS

## 2024-03-29 MED ORDER — ROCURONIUM BROMIDE 100 MG/10ML IV SOLN
INTRAVENOUS | Status: DC | PRN
  Administered 2024-03-29: 10 mg via INTRAVENOUS
  Administered 2024-03-29: 50 mg via INTRAVENOUS

## 2024-03-29 MED ORDER — ACETAMINOPHEN 10 MG/ML IV SOLN
INTRAVENOUS | Status: DC | PRN
  Administered 2024-03-29: 1000 mg via INTRAVENOUS

## 2024-03-29 MED ORDER — OXYCODONE HCL 5 MG PO TABS: 5.0000 mg | ORAL_TABLET | ORAL | 0 refills | Status: AC | PRN

## 2024-03-29 MED ORDER — CEPHALEXIN 500 MG PO CAPS: 500.0000 mg | ORAL_CAPSULE | Freq: Four times a day (QID) | ORAL | 0 refills | Status: AC

## 2024-03-29 MED ORDER — SUCCINYLCHOLINE CHLORIDE 20 MG/ML IJ SOLN
INTRAMUSCULAR | Status: DC | PRN
  Administered 2024-03-29: 200 mg via INTRAVENOUS

## 2024-03-29 MED ORDER — ACETAMINOPHEN 10 MG/ML IV SOLN
INTRAVENOUS | Status: AC
  Filled 2024-03-29: qty 100

## 2024-03-29 MED ORDER — DEXAMETHASONE SODIUM PHOSPHATE 4 MG/ML IJ SOLN
INTRAMUSCULAR | Status: DC | PRN
  Administered 2024-03-29: 5 mg via INTRAVENOUS

## 2024-03-29 MED ORDER — 0.9 % SODIUM CHLORIDE (POUR BTL) OPTIME
TOPICAL | Status: DC | PRN
Start: 2024-03-29 — End: 2024-03-29
  Administered 2024-03-29: 1000 mL

## 2024-03-29 MED ORDER — BUPIVACAINE HCL (PF) 0.25 % IJ SOLN
INTRAMUSCULAR | Status: DC | PRN
  Administered 2024-03-29: 19 mL

## 2024-03-29 MED ORDER — FENTANYL CITRATE (PF) 250 MCG/5ML IJ SOLN
INTRAMUSCULAR | Status: AC
  Filled 2024-03-29: qty 5

## 2024-03-29 MED ORDER — LIDOCAINE HCL (CARDIAC) PF 50 MG/5ML IV SOSY
PREFILLED_SYRINGE | INTRAVENOUS | Status: DC | PRN
  Administered 2024-03-29: 60 mg via INTRAVENOUS

## 2024-03-29 MED ORDER — LIDOCAINE HCL (PF) 1 % IJ SOLN
INTRAMUSCULAR | Status: AC
  Filled 2024-03-29: qty 30

## 2024-03-29 MED ORDER — EPHEDRINE SULFATE (PRESSORS) 50 MG/ML IJ SOLN
INTRAMUSCULAR | Status: DC | PRN
  Administered 2024-03-29 (×3): 5 mg via INTRAVENOUS

## 2024-03-29 MED ORDER — SODIUM CHLORIDE 0.9 % IR SOLN
Status: DC | PRN
  Administered 2024-03-29: 3000 mL

## 2024-03-29 MED ORDER — ONDANSETRON HCL 4 MG/2ML IJ SOLN
INTRAMUSCULAR | Status: DC | PRN
  Administered 2024-03-29: 4 mg via INTRAVENOUS

## 2024-03-29 MED ORDER — CEFAZOLIN SODIUM-DEXTROSE 2-3 GM-%(50ML) IV SOLR
INTRAVENOUS | Status: DC | PRN
  Administered 2024-03-29: 2 g via INTRAVENOUS

## 2024-03-29 SURGICAL SUPPLY — 56 items
BAG COUNTER SPONGE SURGICOUNT (BAG) ×1 IMPLANT
BNDG COHESIVE 1X5 TAN STRL LF (GAUZE/BANDAGES/DRESSINGS) IMPLANT
BNDG COMPR ESMARK 4X3 LF (GAUZE/BANDAGES/DRESSINGS) ×1 IMPLANT
BNDG ELASTIC 3INX 5YD STR LF (GAUZE/BANDAGES/DRESSINGS) ×1 IMPLANT
BNDG ELASTIC 4X5.8 VLCR STR LF (GAUZE/BANDAGES/DRESSINGS) ×1 IMPLANT
BNDG GAUZE DERMACEA FLUFF 4 (GAUZE/BANDAGES/DRESSINGS) ×2 IMPLANT
CORD BIPOLAR FORCEPS 12FT (ELECTRODE) ×1 IMPLANT
COVER SURGICAL LIGHT HANDLE (MISCELLANEOUS) ×2 IMPLANT
CUFF TOURN SGL QUICK 18X4 (TOURNIQUET CUFF) ×1 IMPLANT
CUFF TRNQT CYL 24X4X16.5-23 (TOURNIQUET CUFF) IMPLANT
DRAPE OEC MINIVIEW 54X84 (DRAPES) IMPLANT
DRAPE SURG 17X23 STRL (DRAPES) ×1 IMPLANT
DURAPREP 26ML APPLICATOR (WOUND CARE) ×1 IMPLANT
GAUZE PACKING IODOFORM 1/4X15 (PACKING) IMPLANT
GAUZE SPONGE 2X2 8PLY STRL LF (GAUZE/BANDAGES/DRESSINGS) IMPLANT
GAUZE SPONGE 4X4 12PLY STRL (GAUZE/BANDAGES/DRESSINGS) ×2 IMPLANT
GAUZE STRETCH 2X75IN STRL (MISCELLANEOUS) IMPLANT
GAUZE XEROFORM 1X8 LF (GAUZE/BANDAGES/DRESSINGS) ×1 IMPLANT
GAUZE XEROFORM 5X9 LF (GAUZE/BANDAGES/DRESSINGS) IMPLANT
GLOVE PROTEXIS LATEX SZ 7.5 (GLOVE) ×2 IMPLANT
GLOVE SURG LATEX 7.5 PF (GLOVE) ×2 IMPLANT
GOWN STRL REUS W/ TWL LRG LVL3 (GOWN DISPOSABLE) ×1 IMPLANT
GOWN STRL REUS W/ TWL XL LVL3 (GOWN DISPOSABLE) IMPLANT
GOWN STRL SURGICAL XL XLNG (GOWN DISPOSABLE) ×1 IMPLANT
KIT BASIN OR (CUSTOM PROCEDURE TRAY) ×1 IMPLANT
KIT TURNOVER KIT B (KITS) ×1 IMPLANT
KWIRE .045X4 (WIRE) IMPLANT
MANIFOLD NEPTUNE II (INSTRUMENTS) ×1 IMPLANT
NDL HYPO 25GX1X1/2 BEV (NEEDLE) IMPLANT
NDL HYPO 25X1 1.5 SAFETY (NEEDLE) ×1 IMPLANT
NEEDLE HYPO 25GX1X1/2 BEV (NEEDLE) IMPLANT
NEEDLE HYPO 25X1 1.5 SAFETY (NEEDLE) ×1 IMPLANT
NS IRRIG 1000ML POUR BTL (IV SOLUTION) ×1 IMPLANT
PACK ORTHO EXTREMITY (CUSTOM PROCEDURE TRAY) ×1 IMPLANT
PAD ARMBOARD POSITIONER FOAM (MISCELLANEOUS) ×2 IMPLANT
PAD CAST 3X4 CTTN HI CHSV (CAST SUPPLIES) IMPLANT
PAD CAST 4YDX4 CTTN HI CHSV (CAST SUPPLIES) ×2 IMPLANT
SLING ARM FOAM STRAP LRG (SOFTGOODS) IMPLANT
SPECIMEN JAR SMALL (MISCELLANEOUS) ×1 IMPLANT
SPIKE FLUID TRANSFER (MISCELLANEOUS) ×1 IMPLANT
SPONGE T-LAP 18X18 ~~LOC~~+RFID (SPONGE) ×1 IMPLANT
STRIP CLOSURE SKIN 1/2X4 (GAUZE/BANDAGES/DRESSINGS) IMPLANT
SUCTION TUBE FRAZIER 10FR DISP (SUCTIONS) IMPLANT
SUT ETHILON 4 0 PS 2 18 (SUTURE) IMPLANT
SUT ETHILON 5 0 PS 2 18 (SUTURE) IMPLANT
SUT SUPRAMID 4-0 (SUTURE) IMPLANT
SUT VICRYL RAPIDE 4/0 PS 2 (SUTURE) IMPLANT
SWAB COLLECTION DEVICE MRSA (MISCELLANEOUS) ×1 IMPLANT
SWAB CULTURE ESWAB REG 1ML (MISCELLANEOUS) IMPLANT
SYR CONTROL 10ML LL (SYRINGE) ×1 IMPLANT
TOWEL GREEN STERILE (TOWEL DISPOSABLE) ×1 IMPLANT
TOWEL GREEN STERILE FF (TOWEL DISPOSABLE) ×1 IMPLANT
TUBE CONNECTING 12X1/4 (SUCTIONS) ×1 IMPLANT
UNDERPAD 30X36 HEAVY ABSORB (UNDERPADS AND DIAPERS) ×1 IMPLANT
WATER STERILE IRR 1000ML POUR (IV SOLUTION) ×1 IMPLANT
YANKAUER SUCT BULB TIP NO VENT (SUCTIONS) ×1 IMPLANT

## 2024-03-29 NOTE — Discharge Instructions (Addendum)
 [facility] Post-Operative Instructions    General -These instructions are to compliment information given to you by your surgeon. -You may resume your normal diet as tolerated.  -You may resume your normal medications unless specifically instructed to stop taking a certain medication. -If you are not sure about restarting one of your medications after surgery, please contact the office during normal business hours and we will be able to assist you. -If you take a blood thinner, wait 48 hours before restarting after surgery   Post-Operative Dressings Splint: If you have a splint on your operative arm, it should stay on with all the dressings over it until you return for follow-up. It is ok to take a shower while you are wearing your splint, as long as you do not get it wet. The splint must be covered and kept clean and dry. If your splint gets wet, please call the clinic as you may need to come to clinic early for this to be changed.   If you have questions about your dressings, please call the clinic.   Post-operative Wound Care In general:  Any sutures or anything that will not fall off over time on their own, will be removed in clinic by our staff.  -If your surgical wound/incision was closed with non-absorbable sutures or staples, they will be removed at your first post-op visit.   -If your incision was closed with absorbable sutures, they do not need to be removed as they will dissolve on their own. They may be visible or buried underneath the skin.    You may have one of the following over your incision/wound.  They can all get wet and should remain in place until they fall off on their own. -Small stickers called steri-strips.  You do not need to remove them. If the ends start to peel up, you can carefully trim them with scissors. -Skin glue often known by the brand name Dermabond.  Do not remove, it will fall off over  time.  Certain procedures such as fracture fixation may require the use of pins that stick out through the skin.  If you have visible pins, please be careful to not bump them or move them.    Regardless of any sutures, stickers or glue used to close your wound, do not submerge the wound in any standing water  for at least 4 weeks after surgery.  It is okay to let the shower water  run over the wound and gently clean the wound with soapy water  then rinse and gently pat dry.   Weightbearing/Activity  Do not use your operative extremity for any lifting or weight bearing   Please start finger range of motion exercises right away.  If you have a splint in place, you should move all joints that are not immobilized by a splint and are not painful to move   Pain Control - After your surgery, post-surgical discomfort or pain is normal. This discomfort can last several days to a few weeks. At certain times of the day (usually evenings/nights) your discomfort may be more intense.  - Do not drive while taking narcotic pain medications.   General Anesthesia or Bier Block: If you did not receive a nerve block during your surgery, you will need to start taking your pain medication shortly after your surgery and should continue to do so as prescribed by your surgeon.  Nerve Block:  If you received a nerve block, it may  provide pain relief for one hour to up to two days after your surgery. As long as the nerve block is working, you will experience little or no sensation in the area the surgeon operated on.  As the nerve block wears off, you will begin to experience pain or discomfort. It is very important that you begin taking your prescribed pain medication before the nerve block fully wears off.  Treating your pain at the first sign of the block wearing off will ensure your pain is better controlled and more tolerable when full-sensation returns. Do not wait until the pain is intolerable, as the medicine will  be less effective. It is better to treat pain in advance than to try and catch up.  If the nerve block made your entire arm numb, you will be given an arm sling that you should wear until the block wears off, but no longer than 2 days unless otherwise instructed.   Pain Medication:  Typically, post operative pain can be managed by alternating tylenol  and an anti inflammatory medication.   We may also prescribe an opioid pain medication such as Oxycodone, Percocet (oxycodone with Tylenol ) or Norco (hydrocodone  with Tylenol ) for post-operative pain. Some of these medications contain Tylenol  (acetaminophen ) in them.  It takes between 30 and 45 minutes before pain medication starts to work. It is important to take your medication before your pain level gets too intense.   Nausea is a common side effect of many pain medications. You will want to eat something before taking your pain medicine to help prevent nausea.   If you are taking a prescription opioid pain medication that contains acetaminophen  (Tylenol ), we recommend that you do not take additional over the counter acetaminophen  (Tylenol ).   If you are prescribed oxycodone WITHOUT acetaminophen  (Tylenol ) in it and you do not have a known allergy, we recommend you take over-the-counter acetaminophen  (Tylenol ) with the oxycodone.  Take over-the-counter stool softener such as Colace or Sennakot while taking narcotic pain medications to help prevent constipation.    Other pain relieving options:  Elevation: Elevating your operative extremity can be very helpful to reduce this pain. Prop your arm up on pillows to keep the operative site above the level of your heart. If you develop tingling from prolonged elevation, take a break from elevating and this should resolve.  Icing: If you do not have a splint/cast, using a cold pack to ice the affected area a few times a day (15 to 20 minutes at a time) can also help to relieve pain, reduce swelling  and bruising.   If you can take nonsteroidal anti-inflammatory medications (NSAIDs, for example: Ibuprofen, Advil, Motrin, Aleve, etc.), you may take them to help control your pain. If you are unsure whether you can take anti-inflammatory medications, please check with your primary care provider.  If you are already taking a prescription of anti-inflammatory medications such as Celebrex (celecoxib) or Mobic (meloxicam) you should not take any additional anti-inflammatory drugs such as Ibuprofen, Advil, Motrin, or Aleve.    Follow Up Please call [facility] at [phone] if you do not receive or are unsure of your first follow-up appointment.  You should see your surgeon or PA 10-16 days after your surgery unless otherwise instructed.    Please call the office for any problems, including the following:  - Excessive redness of the incisions - Drainage for more than 4 days - Fever of more than 101.5 F - Nausea/vomiting that does not  stop  - Numbness, tingling, or discoloration of extremity  - Unable to drink fluids  - Uncontrollable pain     Marsa Christen, MD Hand and Upper Extremity Surgery [facility] [phone]

## 2024-03-29 NOTE — Anesthesia Procedure Notes (Signed)
 Procedure Name: Intubation Date/Time: 03/29/2024 1:11 AM  Performed by: Celia Alan HERO, CRNAPre-anesthesia Checklist: Patient identified, Emergency Drugs available, Suction available, Patient being monitored and Timeout performed Patient Re-evaluated:Patient Re-evaluated prior to induction Oxygen Delivery Method: Circle system utilized Preoxygenation: Pre-oxygenation with 100% oxygen Induction Type: IV induction Ventilation: Mask ventilation without difficulty Laryngoscope Size: Miller and 2 Grade View: Grade I Tube type: Oral Tube size: 7.5 mm Number of attempts: 1 Airway Equipment and Method: Stylet Placement Confirmation: ETT inserted through vocal cords under direct vision, positive ETCO2 and breath sounds checked- equal and bilateral Secured at: 23 cm Tube secured with: Tape Dental Injury: Teeth and Oropharynx as per pre-operative assessment

## 2024-03-29 NOTE — Transfer of Care (Signed)
 Immediate Anesthesia Transfer of Care Note  Patient: Ricky Bonilla  Procedure(s) Performed: AMPUTATION, FINGER (Left: Hand) IRRIGATION AND DEBRIDEMENT WOUND (Left: Hand)  Patient Location: PACU  Anesthesia Type:General  Level of Consciousness: awake and alert   Airway & Oxygen Therapy: Patient Spontanous Breathing and Patient connected to nasal cannula oxygen  Post-op Assessment: Report given to RN and Post -op Vital signs reviewed and stable  Post vital signs: Reviewed and stable  Last Vitals:  Vitals Value Taken Time  BP 142/75 03/29/24 05:19  Temp    Pulse 71 03/29/24 05:26  Resp 16 03/29/24 05:26  SpO2 93 % 03/29/24 05:26  Vitals shown include unfiled device data.  Last Pain:  Vitals:   03/28/24 2337  TempSrc:   PainSc: 5          Complications: No notable events documented.

## 2024-03-29 NOTE — Op Note (Signed)
 NAME: Ricky Bonilla MEDICAL RECORD NO: [id number] DATE OF BIRTH: 05-07-1951 FACILITY: [facility] LOCATION: [facility] OR PHYSICIAN: MARSA EMERSON CHRISTEN MD   OPERATIVE REPORT   DATE OF PROCEDURE: 03/29/24    PREOPERATIVE DIAGNOSIS:  Left hand saw injury    POSTOPERATIVE DIAGNOSIS:  Left hand saw injury with left long finger traumatic amputation, left index finger open fracture of proximal phalanx, left thumb IP joint instability and distal phalanx fracture, left thumb FPL tendon laceration   PROCEDURE:   Wound exploration left hand Debridement open fracture Revision amputation left long finger Open reduction and pinning of left index finger proximal phalanx Reduction of thumb IP joint with percutaneous pinning FPL flexor tendon repair   SURGEON: MARSA HERO. Chanz Cahall, M.D.   ASSISTANT: none   ANESTHESIA:  General and Local   INTRAVENOUS FLUIDS:  Per anesthesia flow sheet.   ESTIMATED BLOOD LOSS:  Minimal.   COMPLICATIONS:  None.   SPECIMENS:  none   TOURNIQUET TIME:    Total Tourniquet Time Documented: Upper Arm (Left) - 124 minutes Total: Upper Arm (Left) - 124 minutes    DISPOSITION:  Stable to PACU.   INDICATIONS:  73 year old male who sustained a complex table saw injury to the left hand with traumatic amputation of the left long finger as well as severe injuries to the index and thumb.  The patient was not able to find the digit at the time of injury so it was not brought with him.  Due to the severity and complexity of the injury, the patient was taken urgently to the OR.  Risks, benefits, and alternatives were discussed with the patient and he elected to proceed.  OPERATIVE COURSE:   Patient was identified in holding.  The site, side, and surgery were confirmed with the patient. The patient was brought back to the operating room and transferred to the OR table.  He was positioned supine with the operative extremity on outstretched on a radiolucent hand table.  A  well-padded tourniquet was placed on the upper arm of the operative extremity.    The patient was prepped and draped in typical sterile fashion. A timeout was performed and all were in agreement.  A digital block was given to both the long and ring finger with 5cc for each of a 50:50 mix of 1% lidocaine plain and 0.25% bupivacaine  plain .  The operative extremity was exsanguinated with Esmarch bandage and tourniquet inflated to 250 mmHg.  The wounds were explored and irrigated with 3L normal saline.  There was traumatic amputation of the long finger with residual proximal phalanx base present.  There was a large soft tissue defect of the ulnar side of the index finger down to the comminuted proximal phalanx fracture.  The thumb had a complex volar laceration at the level of the IP joint that completely lacerated the FPL tendon and volar plate and fractured the volar cortex of the distal phalanx.  The thumb IP joint was unstable and could be hyperextended to dislocate the joint.  First the index finger proximal phalanx fracture was addressed.  The fractured ends of the bone were sharply debrided with a curette.  The fractured residual end of the long finger proximal phalanx was also debrided with curettes and rongeurs.    Next the index proximal phalanx was reduced under fluoroscopy and 0.045 k wires were placed percutaneously across the MCP joint and through the reduced proximal phalanx.  The thumb was addressed next.  Large soft tissue defects  were noted on the volar surface.  First the IP joint was percutaneously pinned to stabilize the joint.  Next, the FPL tendon was repaired with 3-0 Supramid suturewith a modified Kessler technique.    Next, the wounds were irrigated with normal saline.  The tourniquet was deflated.   Hemostasis was achieved with bipolar electrocautery.  The skin wounds over the thumb, index, and amputated long finger were reapproximated with 5-0 nylons.  Xeroform was placed over  the incision sites.  This was followed by gauze and sterile Webril.  Patient was then placed in a dorsal blocking splint.  Counts were correct x2.  They were awakened and taken to PACU without issue.  POST-OP PLAN: They will be discharged on pain medication and antibiotics.  Follow up in clinic in 1 week for wound check.  The patient will remain nonweightbearing in the splint until then.   At follow-up appointment they will be seen by occupational therapy and will have a brace made.   MARSA CHRISTELLA CHRISTEN, MD Electronically signed, 03/29/24

## 2024-03-29 NOTE — Brief Op Note (Signed)
 03/29/2024  5:23 AM  PATIENT:  Ricky Bonilla  73 y.o. male  PRE-OPERATIVE DIAGNOSIS:  TRAUMATIC AMPUTATION LEFT LONG FINGER OPEN FRACTURE LEFT INDEX FINGER  POST-OPERATIVE DIAGNOSIS:  TRAUMATIC AMPUTATION LEFT LONG FINGEROPEN FRACTURE LEFT INDEX FINGER  PROCEDURE:  Procedure(s) with comments: AMPUTATION, FINGER (Left) - LONG FINGER AMP IRRIGATION AND DEBRIDEMENT WOUND (Left) - PERCUTANEOUS PINNING OF INDEX FINGER Flexor tendon repair left thumb Debridement of subcutaneous tissue and muscle of open fracture  SURGEON:  Surgeons and Role:    * Kellyn Mccary, Marsa HERO, MD - Primary  PHYSICIAN ASSISTANT:   ASSISTANTS: none   ANESTHESIA:   local and general  EBL:  20cc   BLOOD ADMINISTERED:none  DRAINS: none   LOCAL MEDICATIONS USED:  BUPIVICAINE  and LIDOCAINE   SPECIMEN:  No Specimen  DISPOSITION OF SPECIMEN:  N/A  COUNTS:  YES  TOURNIQUET:   Total Tourniquet Time Documented: Upper Arm (Left) - 124 minutes Total: Upper Arm (Left) - 124 minutes   DICTATION: .Note written in EPIC  PLAN OF CARE: Discharge to home after PACU  PATIENT DISPOSITION:  PACU - hemodynamically stable.   Delay start of Pharmacological VTE agent (>24hrs) due to surgical blood loss or risk of bleeding: yes

## 2024-03-29 NOTE — Anesthesia Preprocedure Evaluation (Addendum)
 Anesthesia Evaluation  Patient identified by MRN, date of birth, ID band Patient awake    Airway Mallampati: I  TM Distance: >3 FB Neck ROM: Full    Dental  (+) Dental Advisory Given, Missing, Partial Upper, Upper Dentures   Pulmonary former smoker   Pulmonary exam normal        Cardiovascular hypertension, Pt. on medications and Pt. on home beta blockers  Rhythm:Regular Rate:Normal     Neuro/Psych negative neurological ROS  negative psych ROS   GI/Hepatic negative GI ROS, Neg liver ROS,,,  Endo/Other  negative endocrine ROS    Renal/GU negative Renal ROS  negative genitourinary   Musculoskeletal  (+) Arthritis , Osteoarthritis,    Abdominal Normal abdominal exam  (+)   Peds  Hematology Lab Results      Component                Value               Date                      WBC                      8.9                 03/28/2024                HGB                      12.5 (L)            03/28/2024                HCT                      37.1 (L)            03/28/2024                MCV                      93.0                03/28/2024                PLT                      158                 03/28/2024             Lab Results      Component                Value               Date                      NA                       138                 03/28/2024                K                        4.1  03/28/2024                CO2                      20 (L)              03/28/2024                GLUCOSE                  107 (H)             03/28/2024                BUN                      24 (H)              03/28/2024                CREATININE               1.15                03/28/2024                CALCIUM                  8.8 (L)             03/28/2024                GFRNONAA                 >60                 03/28/2024              Anesthesia Other Findings    Reproductive/Obstetrics                              Anesthesia Physical Anesthesia Plan  ASA: 2  Anesthesia Plan: General   Post-op Pain Management:    Induction: Intravenous  PONV Risk Score and Plan: 2 and Ondansetron , Dexamethasone  and Treatment may vary due to age or medical condition  Airway Management Planned: Mask and Oral ETT  Additional Equipment: None  Intra-op Plan:   Post-operative Plan: Extubation in OR  Informed Consent: I have reviewed the patients History and Physical, chart, labs and discussed the procedure including the risks, benefits and alternatives for the proposed anesthesia with the patient or authorized representative who has indicated his/her understanding and acceptance.     Dental advisory given  Plan Discussed with: CRNA  Anesthesia Plan Comments:         Anesthesia Quick Evaluation

## 2024-03-30 ENCOUNTER — Encounter (HOSPITAL_COMMUNITY): Payer: Self-pay | Admitting: Orthopedic Surgery

## 2024-03-30 ENCOUNTER — Encounter: Payer: Self-pay | Admitting: Orthopedic Surgery

## 2024-03-30 DIAGNOSIS — S66022A Laceration of long flexor muscle, fascia and tendon of left thumb at wrist and hand level, initial encounter: Secondary | ICD-10-CM | POA: Insufficient documentation

## 2024-03-30 DIAGNOSIS — S68613A Complete traumatic transphalangeal amputation of left middle finger, initial encounter: Secondary | ICD-10-CM | POA: Insufficient documentation

## 2024-03-30 DIAGNOSIS — S62611B Displaced fracture of proximal phalanx of left index finger, initial encounter for open fracture: Secondary | ICD-10-CM | POA: Insufficient documentation

## 2024-03-31 NOTE — Anesthesia Postprocedure Evaluation (Signed)
 Anesthesia Post Note  Patient: Ricky Bonilla  Procedure(s) Performed: AMPUTATION, FINGER (Left: Hand) IRRIGATION AND DEBRIDEMENT WOUND (Left: Hand)     Patient location during evaluation: PACU Anesthesia Type: General Level of consciousness: awake and alert Pain management: pain level controlled Vital Signs Assessment: post-procedure vital signs reviewed and stable Respiratory status: spontaneous breathing, nonlabored ventilation, respiratory function stable and patient connected to nasal cannula oxygen Cardiovascular status: blood pressure returned to baseline and stable Postop Assessment: no apparent nausea or vomiting Anesthetic complications: no   No notable events documented.  Last Vitals:  Vitals:   03/29/24 0550 03/29/24 0555  BP: 128/69   Pulse: 68   Resp: 20   Temp: 36.6 C   SpO2: 94% 95%    Last Pain:  Vitals:   03/29/24 0550  TempSrc:   PainSc: 0-No pain                 Cordella P Secret Kristensen

## 2024-04-06 DIAGNOSIS — M25642 Stiffness of left hand, not elsewhere classified: Secondary | ICD-10-CM | POA: Diagnosis not present

## 2024-04-06 DIAGNOSIS — S68113A Complete traumatic metacarpophalangeal amputation of left middle finger, initial encounter: Secondary | ICD-10-CM | POA: Diagnosis not present

## 2024-04-06 DIAGNOSIS — M79645 Pain in left finger(s): Secondary | ICD-10-CM | POA: Diagnosis not present

## 2024-04-14 DIAGNOSIS — M25642 Stiffness of left hand, not elsewhere classified: Secondary | ICD-10-CM | POA: Diagnosis not present

## 2024-04-14 DIAGNOSIS — M79645 Pain in left finger(s): Secondary | ICD-10-CM | POA: Diagnosis not present

## 2024-04-20 DIAGNOSIS — S62611D Displaced fracture of proximal phalanx of left index finger, subsequent encounter for fracture with routine healing: Secondary | ICD-10-CM | POA: Diagnosis not present

## 2024-04-21 DIAGNOSIS — M25642 Stiffness of left hand, not elsewhere classified: Secondary | ICD-10-CM | POA: Diagnosis not present

## 2024-04-21 DIAGNOSIS — M79645 Pain in left finger(s): Secondary | ICD-10-CM | POA: Diagnosis not present

## 2024-04-28 DIAGNOSIS — M25642 Stiffness of left hand, not elsewhere classified: Secondary | ICD-10-CM | POA: Diagnosis not present

## 2024-04-28 DIAGNOSIS — S62611D Displaced fracture of proximal phalanx of left index finger, subsequent encounter for fracture with routine healing: Secondary | ICD-10-CM | POA: Diagnosis not present

## 2024-04-28 DIAGNOSIS — M79645 Pain in left finger(s): Secondary | ICD-10-CM | POA: Diagnosis not present

## 2024-05-07 DIAGNOSIS — M25642 Stiffness of left hand, not elsewhere classified: Secondary | ICD-10-CM | POA: Diagnosis not present

## 2024-05-07 DIAGNOSIS — M79645 Pain in left finger(s): Secondary | ICD-10-CM | POA: Diagnosis not present

## 2024-05-07 DIAGNOSIS — S62611D Displaced fracture of proximal phalanx of left index finger, subsequent encounter for fracture with routine healing: Secondary | ICD-10-CM | POA: Diagnosis not present

## 2024-05-25 DIAGNOSIS — S62611D Displaced fracture of proximal phalanx of left index finger, subsequent encounter for fracture with routine healing: Secondary | ICD-10-CM | POA: Diagnosis not present

## 2024-05-25 DIAGNOSIS — M25642 Stiffness of left hand, not elsewhere classified: Secondary | ICD-10-CM | POA: Diagnosis not present

## 2024-05-25 DIAGNOSIS — M79645 Pain in left finger(s): Secondary | ICD-10-CM | POA: Diagnosis not present

## 2024-06-02 DIAGNOSIS — M25642 Stiffness of left hand, not elsewhere classified: Secondary | ICD-10-CM | POA: Diagnosis not present

## 2024-06-02 DIAGNOSIS — M79645 Pain in left finger(s): Secondary | ICD-10-CM | POA: Diagnosis not present

## 2024-06-15 DIAGNOSIS — H43393 Other vitreous opacities, bilateral: Secondary | ICD-10-CM | POA: Diagnosis not present

## 2024-06-18 DIAGNOSIS — M79645 Pain in left finger(s): Secondary | ICD-10-CM | POA: Diagnosis not present

## 2024-06-18 DIAGNOSIS — M25642 Stiffness of left hand, not elsewhere classified: Secondary | ICD-10-CM | POA: Diagnosis not present

## 2024-06-22 DIAGNOSIS — S68113A Complete traumatic metacarpophalangeal amputation of left middle finger, initial encounter: Secondary | ICD-10-CM | POA: Diagnosis not present

## 2024-06-25 DIAGNOSIS — M25642 Stiffness of left hand, not elsewhere classified: Secondary | ICD-10-CM | POA: Diagnosis not present

## 2024-06-25 DIAGNOSIS — M79645 Pain in left finger(s): Secondary | ICD-10-CM | POA: Diagnosis not present

## 2024-07-02 DIAGNOSIS — M79645 Pain in left finger(s): Secondary | ICD-10-CM | POA: Diagnosis not present

## 2024-07-02 DIAGNOSIS — M25642 Stiffness of left hand, not elsewhere classified: Secondary | ICD-10-CM | POA: Diagnosis not present

## 2024-07-09 DIAGNOSIS — M79645 Pain in left finger(s): Secondary | ICD-10-CM | POA: Diagnosis not present

## 2024-07-09 DIAGNOSIS — M25642 Stiffness of left hand, not elsewhere classified: Secondary | ICD-10-CM | POA: Diagnosis not present

## 2024-07-16 DIAGNOSIS — M79645 Pain in left finger(s): Secondary | ICD-10-CM | POA: Diagnosis not present

## 2024-07-16 DIAGNOSIS — M25642 Stiffness of left hand, not elsewhere classified: Secondary | ICD-10-CM | POA: Diagnosis not present

## 2024-07-22 DIAGNOSIS — M79645 Pain in left finger(s): Secondary | ICD-10-CM | POA: Diagnosis not present

## 2024-07-22 DIAGNOSIS — M25642 Stiffness of left hand, not elsewhere classified: Secondary | ICD-10-CM | POA: Diagnosis not present

## 2024-07-23 DIAGNOSIS — Z85828 Personal history of other malignant neoplasm of skin: Secondary | ICD-10-CM | POA: Diagnosis not present

## 2024-07-23 DIAGNOSIS — Z08 Encounter for follow-up examination after completed treatment for malignant neoplasm: Secondary | ICD-10-CM | POA: Diagnosis not present

## 2024-07-23 DIAGNOSIS — D485 Neoplasm of uncertain behavior of skin: Secondary | ICD-10-CM | POA: Diagnosis not present

## 2024-07-23 DIAGNOSIS — D225 Melanocytic nevi of trunk: Secondary | ICD-10-CM | POA: Diagnosis not present

## 2024-07-23 DIAGNOSIS — Z1283 Encounter for screening for malignant neoplasm of skin: Secondary | ICD-10-CM | POA: Diagnosis not present

## 2024-07-24 DIAGNOSIS — Z23 Encounter for immunization: Secondary | ICD-10-CM | POA: Diagnosis not present

## 2024-07-29 DIAGNOSIS — M79645 Pain in left finger(s): Secondary | ICD-10-CM | POA: Diagnosis not present

## 2024-07-29 DIAGNOSIS — M25642 Stiffness of left hand, not elsewhere classified: Secondary | ICD-10-CM | POA: Diagnosis not present
